# Patient Record
Sex: Female | Born: 1940 | Hispanic: Yes | Smoking: Never smoker
Health system: Southern US, Community
[De-identification: ages and names within clinical notes are randomized; demographics above are authoritative.]

## PROBLEM LIST (undated history)

## (undated) DIAGNOSIS — E785 Hyperlipidemia, unspecified: Secondary | ICD-10-CM

## (undated) DIAGNOSIS — Q761 Klippel-Feil syndrome: Secondary | ICD-10-CM

## (undated) DIAGNOSIS — T7840XA Allergy, unspecified, initial encounter: Secondary | ICD-10-CM

## (undated) DIAGNOSIS — F419 Anxiety disorder, unspecified: Secondary | ICD-10-CM

## (undated) DIAGNOSIS — K219 Gastro-esophageal reflux disease without esophagitis: Secondary | ICD-10-CM

## (undated) DIAGNOSIS — I10 Essential (primary) hypertension: Secondary | ICD-10-CM

## (undated) HISTORY — DX: Anxiety disorder, unspecified: F41.9

## (undated) HISTORY — DX: Allergy, unspecified, initial encounter: T78.40XA

## (undated) HISTORY — DX: Gastro-esophageal reflux disease without esophagitis: K21.9

## (undated) HISTORY — DX: Essential (primary) hypertension: I10

## (undated) HISTORY — DX: Klippel-Feil syndrome: Q76.1

## (undated) HISTORY — DX: Hyperlipidemia, unspecified: E78.5

---

## 2001-02-25 HISTORY — PX: CERVICAL FUSION: SHX112

## 2020-02-23 ENCOUNTER — Ambulatory Visit: Payer: Medicare Other | Admitting: Medical

## 2020-03-10 ENCOUNTER — Ambulatory Visit: Payer: Medicare Other | Admitting: Medical

## 2020-03-15 ENCOUNTER — Encounter: Payer: Self-pay | Admitting: Medical

## 2020-03-15 ENCOUNTER — Ambulatory Visit (INDEPENDENT_AMBULATORY_CARE_PROVIDER_SITE_OTHER): Payer: Medicare Other | Admitting: Medical

## 2020-03-15 ENCOUNTER — Other Ambulatory Visit: Payer: Self-pay

## 2020-03-15 VITALS — BP 138/53 | HR 74 | Resp 18 | Ht 60.0 in | Wt 121.6 lb

## 2020-03-15 DIAGNOSIS — R739 Hyperglycemia, unspecified: Secondary | ICD-10-CM

## 2020-03-15 DIAGNOSIS — F419 Anxiety disorder, unspecified: Secondary | ICD-10-CM

## 2020-03-15 DIAGNOSIS — R7989 Other specified abnormal findings of blood chemistry: Secondary | ICD-10-CM

## 2020-03-15 DIAGNOSIS — I10 Essential (primary) hypertension: Secondary | ICD-10-CM | POA: Diagnosis not present

## 2020-03-15 DIAGNOSIS — M2550 Pain in unspecified joint: Secondary | ICD-10-CM | POA: Diagnosis not present

## 2020-03-15 DIAGNOSIS — E785 Hyperlipidemia, unspecified: Secondary | ICD-10-CM

## 2020-03-15 DIAGNOSIS — K219 Gastro-esophageal reflux disease without esophagitis: Secondary | ICD-10-CM | POA: Diagnosis not present

## 2020-03-15 DIAGNOSIS — Z7185 Encounter for immunization safety counseling: Secondary | ICD-10-CM

## 2020-03-15 LAB — COMPREHENSIVE METABOLIC PANEL WITH GFR
ALT: 13 U/L (ref 0–35)
AST: 26 U/L (ref 0–37)
Albumin: 4.4 g/dL (ref 3.5–5.2)
Alkaline Phosphatase: 91 U/L (ref 39–117)
BUN: 21 mg/dL (ref 6–23)
CO2: 34 meq/L — ABNORMAL HIGH (ref 19–32)
Calcium: 9.9 mg/dL (ref 8.4–10.5)
Chloride: 97 meq/L (ref 96–112)
Creatinine, Ser: 0.91 mg/dL (ref 0.40–1.20)
GFR: 60.48 mL/min
Glucose, Bld: 90 mg/dL (ref 70–99)
Potassium: 4.3 meq/L (ref 3.5–5.1)
Sodium: 138 meq/L (ref 135–145)
Total Bilirubin: 0.5 mg/dL (ref 0.2–1.2)
Total Protein: 8.7 g/dL — ABNORMAL HIGH (ref 6.0–8.3)

## 2020-03-15 LAB — CBC WITH DIFFERENTIAL/PLATELET
Basophils Absolute: 0 10*3/uL (ref 0.0–0.1)
Basophils Relative: 0.5 % (ref 0.0–3.0)
Eosinophils Absolute: 0.1 10*3/uL (ref 0.0–0.7)
Eosinophils Relative: 1.6 % (ref 0.0–5.0)
HCT: 41.7 % (ref 36.0–46.0)
Hemoglobin: 14.3 g/dL (ref 12.0–15.0)
Lymphocytes Relative: 41.4 % (ref 12.0–46.0)
Lymphs Abs: 2.4 10*3/uL (ref 0.7–4.0)
MCHC: 34.3 g/dL (ref 30.0–36.0)
MCV: 94.1 fl (ref 78.0–100.0)
Monocytes Absolute: 0.6 10*3/uL (ref 0.1–1.0)
Monocytes Relative: 10.7 % (ref 3.0–12.0)
Neutro Abs: 2.6 10*3/uL (ref 1.4–7.7)
Neutrophils Relative %: 45.8 % (ref 43.0–77.0)
Platelets: 201 10*3/uL (ref 150.0–400.0)
RBC: 4.44 Mil/uL (ref 3.87–5.11)
RDW: 12.4 % (ref 11.5–15.5)
WBC: 5.8 10*3/uL (ref 4.0–10.5)

## 2020-03-15 LAB — LIPID PANEL
Cholesterol: 174 mg/dL (ref 0–200)
HDL: 59 mg/dL (ref >39.00)
LDL Cholesterol: 88 mg/dL (ref 0–99)
NonHDL: 114.56
Total CHOL/HDL Ratio: 3
Triglycerides: 131 mg/dL (ref 0.0–149.0)
VLDL: 26.2 mg/dL (ref 0.0–40.0)

## 2020-03-15 LAB — HEMOGLOBIN A1C: Hgb A1c MFr Bld: 6 % (ref 4.6–6.5)

## 2020-03-15 LAB — SEDIMENTATION RATE: Sed Rate: 67 mm/hr — ABNORMAL HIGH (ref 0–30)

## 2020-03-15 LAB — C-REACTIVE PROTEIN: CRP: <1 mg/dL (ref 0.5–20.0)

## 2020-03-15 MED ORDER — PANTOPRAZOLE SODIUM 20 MG PO TBEC: 20.0000 mg | DELAYED_RELEASE_TABLET | Freq: Two times a day (BID) | ORAL | 3 refills | Status: DC

## 2020-03-15 MED ORDER — VITAMIN D 25 MCG (1000 UNIT) PO TABS: 1000.0000 [IU] | ORAL_TABLET | Freq: Every day | ORAL | 3 refills | Status: DC

## 2020-03-15 MED ORDER — ASPIRIN 81 MG PO CHEW
81.0000 mg | CHEWABLE_TABLET | Freq: Every day | ORAL | 3 refills | Status: DC
Start: 2020-03-15 — End: 2020-03-17

## 2020-03-15 MED ORDER — METOPROLOL TARTRATE 25 MG PO TABS: 25.0000 mg | ORAL_TABLET | Freq: Two times a day (BID) | ORAL | 3 refills | Status: DC

## 2020-03-15 MED ORDER — VITAMIN B-1 50 MG PO TABS: 50.0000 mg | ORAL_TABLET | Freq: Every day | ORAL | 1 refills | Status: DC

## 2020-03-15 MED ORDER — HYDROCHLOROTHIAZIDE 12.5 MG PO CAPS: 12.5000 mg | ORAL_CAPSULE | Freq: Every day | ORAL | 3 refills | Status: DC

## 2020-03-15 NOTE — Progress Notes (Addendum)
Subjective:    Patient ID: Nina Holmes, female    DOB: [birth year], 80 y.o.   MRN: [id number]  HPI  Pt in for the first time.  Pt has prediabetes, arthritis, diverticulitis, allergic rhinitis, gerd, htn, hyperlipidemia, anxiety and remote hx of heart palpitations.  Gerd controlled on protonix 20 mg daily.  htn controlled on metoprolol 25 mg twice daily and hctz 12.5mg  daily.  Hx of low vit D.  Hx of high cholesterol. On atorvastatin.   Diverticulitis hx but no flares in past 5 years.   Anxiety controlled on low dose xanax. Max use 25-30 tab a month.  Recent stress from moving.      Review of Systems  Constitutional: Negative for chills, fatigue and fever.  HENT: Negative for congestion.   Respiratory: Negative for cough, chest tightness, shortness of breath and wheezing.   Cardiovascular: Negative for chest pain and palpitations.  Gastrointestinal: Negative for abdominal pain, blood in stool, diarrhea and vomiting.       Genella Rife currently controlled.  Genitourinary: Negative for dysuria and flank pain.  Musculoskeletal: Negative for back pain, joint swelling and neck pain.  Skin: Negative for rash.  Neurological: Negative for dizziness, speech difficulty, weakness, numbness and headaches.  Hematological: Negative for adenopathy. Does not bruise/bleed easily.  Psychiatric/Behavioral: Negative for dysphoric mood. The patient is nervous/anxious.     No past medical history on file.   Social History   Socioeconomic History  . Marital status: Divorced    Spouse name: Not on file  . Number of children: Not on file  . Years of education: Not on file  . Highest education level: Not on file  Occupational History  . Not on file  Tobacco Use  . Smoking status: Not on file  . Smokeless tobacco: Not on file  Substance and Sexual Activity  . Alcohol use: Not on file  . Drug use: Not on file  . Sexual activity: Not on file  Other Topics Concern  . Not on file   Social History Narrative  . Not on file   Social Determinants of Health   Financial Resource Strain: Not on file  Food Insecurity: Not on file  Transportation Needs: Not on file  Physical Activity: Not on file  Stress: Not on file  Social Connections: Not on file  Intimate Partner Violence: Not on file     No family history on file.  Not on File  Current Outpatient Medications on File Prior to Visit  Medication Sig Dispense Refill  . ALPRAZolam (XANAX) 0.5 MG tablet Take 0.5 mg by mouth at bedtime as needed for anxiety.    Marland Kitchen aspirin 81 MG chewable tablet Chew by mouth daily.    . cholecalciferol (VITAMIN D3) 25 MCG (1000 UNIT) tablet Take 1,000 Units by mouth daily.    . hydrochlorothiazide (MICROZIDE) 12.5 MG capsule Take 12.5 mg by mouth daily.    Marland Kitchen thiamine (VITAMIN B-1) 50 MG tablet Take 50 mg by mouth daily.    Marland Kitchen atorvastatin (LIPITOR) 10 MG tablet Take 10 mg by mouth daily.    . metoprolol tartrate (LOPRESSOR) 25 MG tablet Take 25 mg by mouth 2 (two) times daily.    . pantoprazole (PROTONIX) 20 MG tablet Take 20 mg by mouth 2 (two) times daily.     No current facility-administered medications on file prior to visit.    BP (!) 138/53   Pulse 74   Resp 18   Ht 5' (1.524 m)  Wt 121 lb 9.6 oz (55.2 kg)   SpO2 98%   BMI 23.75 kg/m      Objective:   Physical Exam  General Mental Status- Alert. General Appearance- Not in acute distress.   Skin General: Color- Normal Color. Moisture- Normal Moisture.  Neck Carotid Arteries- Normal color. Moisture- Normal Moisture. No carotid bruits. No JVD.  Chest and Lung Exam Auscultation: Breath Sounds:-Normal.  Cardiovascular Auscultation:Rythm- Regular. Murmurs & Other Heart Sounds:Auscultation of the heart reveals- No Murmurs.  Abdomen Inspection:-Inspeection Normal. Palpation/Percussion:Note:No mass. Palpation and Percussion of the abdomen reveal- Non Tender, Non Distended + BS, no rebound or  guarding.   Neurologic Cranial Nerve exam:- CN III-XII intact(No nystagmus), symmetric smile. Strength:- 5/5 equal and symmetric strength both upper and lower extremities.      Assessment & Plan:  Pt has prediabetes, arthritis, diverticulitis, allergic rhinitis, gerd, htn, hyperlipidemia, anxiety and remote hx of heart palpitations.  Allergic rhinitis controlled presently.  Gerd controlled on protonix 20 mg daily.  htn controlled on metoprolol 25 mg twice daily and hctz 12.5mg  daily.  Hx of low vit D. Will repeat level today.  Hx of high cholesterol. On atorvastatin.   Diverticulitis hx but no flares in past 5 years.   Anxiety controlled on low dose xanax. Max use 25-30 tab a month.  After labs may be able to prescribe nsaid.  Prior palpitations appear to have be anxiety related.   Esperanza Richters, PA-C   Time spent with patient today was 45  Minutes consisted of discussing various  diagnosis, work up, treatment, explaining drug screen/contract. and documentation.

## 2020-03-15 NOTE — Addendum Note (Signed)
Addended by: Gwenevere Abbot on: 03/15/2020 07:38 PM   Modules accepted: Orders

## 2020-03-15 NOTE — Addendum Note (Signed)
Addended by: Gwenevere Abbot on: 03/15/2020 05:43 PM   Modules accepted: Level of Service

## 2020-03-15 NOTE — Addendum Note (Signed)
Addended by: Gwenevere Abbot on: 03/15/2020 02:00 PM   Modules accepted: Level of Service

## 2020-03-15 NOTE — Patient Instructions (Addendum)
Pt has prediabetes, arthritis, diverticulitis, allergic rhinitis, gerd, htn, hyperlipidemia, anxiety and remote hx of heart palpitations.  Allergic rhinitis controlled presently.  Gerd controlled on protonix 20 mg daily.  htn controlled on metoprolol 25 mg twice daily and hctz 12.5mg  daily.  Hx of low vit D. Will repeat level today.  Hx of high cholesterol. On atorvastatin.   Diverticulitis hx but no flares in past 5 years.   Anxiety controlled on low dose xanax. Max use 25-30 tab a month.  Prior palpitations appear to have be anxiety related. If reoccur notify us.  After labs may be able to prescribe nsaid.  Follow up in one month or as needed

## 2020-03-16 ENCOUNTER — Telehealth: Payer: Self-pay | Admitting: Medical

## 2020-03-16 MED ORDER — METHYLPREDNISOLONE 4 MG PO TABS: ORAL_TABLET | ORAL | 0 refills | Status: DC

## 2020-03-16 NOTE — Telephone Encounter (Signed)
Medrol taper sent for elevated sed rate, hip pain and back pain.

## 2020-03-17 ENCOUNTER — Telehealth: Payer: Self-pay | Admitting: Medical

## 2020-03-17 ENCOUNTER — Ambulatory Visit: Payer: Medicare Other | Admitting: Medical

## 2020-03-17 MED ORDER — ATORVASTATIN CALCIUM 10 MG PO TABS: 10.0000 mg | ORAL_TABLET | Freq: Every day | ORAL | 2 refills | Status: DC

## 2020-03-17 MED ORDER — PANTOPRAZOLE SODIUM 20 MG PO TBEC: 20.0000 mg | DELAYED_RELEASE_TABLET | Freq: Two times a day (BID) | ORAL | 3 refills | Status: DC

## 2020-03-17 MED ORDER — METOPROLOL TARTRATE 25 MG PO TABS: 25.0000 mg | ORAL_TABLET | Freq: Two times a day (BID) | ORAL | 3 refills | Status: DC

## 2020-03-17 MED ORDER — VITAMIN B-1 50 MG PO TABS: 50.0000 mg | ORAL_TABLET | Freq: Every day | ORAL | 1 refills | Status: DC

## 2020-03-17 MED ORDER — METHYLPREDNISOLONE 4 MG PO TABS: ORAL_TABLET | ORAL | 0 refills | Status: DC

## 2020-03-17 MED ORDER — VITAMIN D 25 MCG (1000 UNIT) PO TABS: 1000.0000 [IU] | ORAL_TABLET | Freq: Every day | ORAL | 3 refills | Status: DC

## 2020-03-17 MED ORDER — ASPIRIN 81 MG PO CHEW: 81.0000 mg | CHEWABLE_TABLET | Freq: Every day | ORAL | 3 refills | Status: DC

## 2020-03-17 MED ORDER — HYDROCHLOROTHIAZIDE 12.5 MG PO CAPS: 12.5000 mg | ORAL_CAPSULE | Freq: Every day | ORAL | 3 refills | Status: DC

## 2020-03-17 NOTE — Telephone Encounter (Signed)
Patient spoke with ruth .

## 2020-03-17 NOTE — Telephone Encounter (Signed)
Rx sent 

## 2020-03-17 NOTE — Telephone Encounter (Signed)
Patient states all medication were sent to the wrong pharmacy.  aspirin 81 MG chewable tablet [[id number]]   cholecalciferol (VITAMIN D3) 25 MCG (1000 UNIT) tablet [[id number]]   hydrochlorothiazide (MICROZIDE) 12.5 MG capsule [[id number]]   methylPREDNISolone (MEDROL) 4 MG tablet [[id number]]  metoprolol tartrate (LOPRESSOR) 25 MG tablet [[id number]]   pantoprazole (PROTONIX) 20 MG tablet [[id number]]   thiamine (VITAMIN B-1) 50 MG tablet [[id number]]    [id number] - [place], [place] - [address] AT [facility] [address] Phone:  [phone]  Fax:  [phone]

## 2020-03-17 NOTE — Telephone Encounter (Signed)
Called back for lab results

## 2020-03-20 LAB — VITAMIN D 1,25 DIHYDROXY
Vitamin D 1, 25 (OH)2 Total: 25 pg/mL (ref 18–72)
Vitamin D2 1, 25 (OH)2: <8 pg/mL
Vitamin D3 1, 25 (OH)2: 25 pg/mL

## 2020-03-20 LAB — RHEUMATOID FACTOR: Rheumatoid fact SerPl-aCnc: <14 IU/mL (ref <14)

## 2020-03-20 LAB — ANA: Anti Nuclear Antibody (ANA): NEGATIVE

## 2020-03-22 ENCOUNTER — Telehealth: Payer: Self-pay | Admitting: Medical

## 2020-03-22 NOTE — Telephone Encounter (Signed)
All other meds sent to pharmacy on 03/17/2020; just need xanax   Requesting: xanax Contract: n/a UDS: n/a Last Visit:03/15/2020 Next Visit:2/21 Last Refill: n/a  Please Advise

## 2020-03-22 NOTE — Telephone Encounter (Signed)
Medication:  metoprolol tartrate (LOPRESSOR) 25 MG tablet [[id number]  hydrochlorothiazide (MICROZIDE) 12.5 MG capsule [[id number]]   atorvastatin (LIPITOR) 10 MG tablet [[id number]]   ALPRAZolam (XANAX) 0.5 MG tablet [[id number]]   pantoprazole (PROTONIX) 20 MG tablet [[id number]]   thiamine (VITAMIN B-1) 50 MG tablet [[id number]]   cholecalciferol (VITAMIN D3) 25 MCG (1000 UNIT) tablet [[id number]]     Has the patient contacted their pharmacy? no (If no, request that the patient contact the pharmacy for the refill.) (If yes, when and what did the pharmacy advise?)    Preferred Pharmacy (with phone number or street name):   [id number] - [place], [place] - [address] AT [facility] [address] Phone:  [phone]  Fax:  [phone]         Agent: Please be advised that RX refills may take up to 3 business days. We ask that you follow-up with your pharmacy.

## 2020-03-23 MED ORDER — ALPRAZOLAM 0.5 MG PO TABS: 0.5000 mg | ORAL_TABLET | Freq: Every evening | ORAL | 0 refills | Status: DC | PRN

## 2020-03-23 NOTE — Telephone Encounter (Signed)
14 tab rx sent to pt pharmacy. She needs to have uds and do contract within next 2 weeks. Then can give 30 tab rx on next refill

## 2020-03-23 NOTE — Addendum Note (Signed)
Addended by: Gwenevere Abbot on: 03/23/2020 12:48 PM   Modules accepted: Orders

## 2020-03-24 NOTE — Telephone Encounter (Signed)
Called and left VM on both the pt and daughters phone number. -JMA

## 2020-03-28 NOTE — Telephone Encounter (Signed)
Called pt and advised about UDS she stated that she has an apt on 04/17/20 and will wait until then to do all at once. -JMA

## 2020-04-10 ENCOUNTER — Ambulatory Visit: Payer: Medicare Other | Admitting: Medical

## 2020-04-17 ENCOUNTER — Ambulatory Visit: Payer: Medicare Other | Admitting: Medical

## 2020-05-10 ENCOUNTER — Other Ambulatory Visit: Payer: Self-pay | Admitting: Medical

## 2020-05-10 DIAGNOSIS — F419 Anxiety disorder, unspecified: Secondary | ICD-10-CM

## 2020-05-10 DIAGNOSIS — R8290 Unspecified abnormal findings in urine: Secondary | ICD-10-CM

## 2020-05-10 MED ORDER — PANTOPRAZOLE SODIUM 20 MG PO TBEC: 20.0000 mg | DELAYED_RELEASE_TABLET | Freq: Two times a day (BID) | ORAL | 3 refills | Status: DC

## 2020-05-10 MED ORDER — ATORVASTATIN CALCIUM 10 MG PO TABS: 10.0000 mg | ORAL_TABLET | Freq: Every day | ORAL | 2 refills | Status: DC

## 2020-05-10 NOTE — Telephone Encounter (Signed)
Requesting: xanax Contract:n/a UDS:n/a Last Visit: 02/2020 Next Visit:n/a Last Refill:03/13/2020  Please Advise

## 2020-05-10 NOTE — Telephone Encounter (Signed)
Medication: ALPRAZolam (XANAX) 0.5 MG tablet [[id number]]   atorvastatin (LIPITOR) 10 MG tablet [[id number]]   pantoprazole (PROTONIX) 20 MG tablet [[id number]]      Has the patient contacted their pharmacy? no (If no, request that the patient contact the pharmacy for the refill.) (If yes, when and what did the pharmacy advise?)    Preferred Pharmacy (with phone number or street name):   [id number] - [place], [place] - [address] AT [facility] [address] Phone:  [phone]  Fax:  [phone]          Agent: Please be advised that RX refills may take up to 3 business days. We ask that you follow-up with your pharmacy.

## 2020-05-10 NOTE — Telephone Encounter (Signed)
Refilled atorvastatin and protonix. Needs appt to discuss anxiety. May need uds and contract going forward.

## 2020-05-10 NOTE — Telephone Encounter (Addendum)
Requesting:Xanax Contract: n/a  UDS: n/a Last Visit:03/15/20 Next Visit:n/a Last Refill:02/2020  Please Advise  New pt who I saw one time. Limited rx given. Have her follow up. She used 14 tab in 2 months. Best to have her be on contract/uds so can fill at regular intervals when needed.  Declined the rx today  If having acute anxiety episodes recently and can't get in until next Monday let me know and will send in 6-8 tab prescription.

## 2020-05-12 NOTE — Telephone Encounter (Signed)
No UDS just contract

## 2020-05-12 NOTE — Telephone Encounter (Signed)
Contract was done at time of initial visit , medical records just uploaded xanax to file

## 2020-05-13 NOTE — Telephone Encounter (Signed)
Have pt schedule folllow up next week. Signs contract, give uds so can prescribe when needed going forward.

## 2020-05-15 NOTE — Telephone Encounter (Signed)
When she give urine on 25 th let me know and will send in xanax rx at that time.  What diagnosis did you place for ua and culture. On review of my last note I don't see that she had any symptoms at that time.

## 2020-05-19 ENCOUNTER — Other Ambulatory Visit (INDEPENDENT_AMBULATORY_CARE_PROVIDER_SITE_OTHER): Payer: Medicare Other

## 2020-05-19 ENCOUNTER — Other Ambulatory Visit: Payer: Self-pay

## 2020-05-19 ENCOUNTER — Other Ambulatory Visit: Payer: Medicare Other

## 2020-05-19 DIAGNOSIS — Z79899 Other long term (current) drug therapy: Secondary | ICD-10-CM

## 2020-05-19 DIAGNOSIS — F419 Anxiety disorder, unspecified: Secondary | ICD-10-CM

## 2020-05-19 DIAGNOSIS — R8290 Unspecified abnormal findings in urine: Secondary | ICD-10-CM | POA: Diagnosis not present

## 2020-05-19 LAB — POC URINALSYSI DIPSTICK (AUTOMATED)
Bilirubin, UA: NEGATIVE
Glucose, UA: NEGATIVE
Ketones, UA: NEGATIVE
Nitrite, UA: NEGATIVE
Protein, UA: POSITIVE — AB
Spec Grav, UA: >=1.03 — AB (ref 1.010–1.025)
Urobilinogen, UA: 0.2 E.U./dL
pH, UA: 5 (ref 5.0–8.0)

## 2020-05-19 NOTE — Progress Notes (Signed)
Pt was not able to get enough urine to complete uds. Only had enough urine to do partial culture and am not sure if lab can do culture on the amount sent but we will await determination from lab.  Pt will return Monday for UDS.

## 2020-05-21 ENCOUNTER — Telehealth: Payer: Self-pay | Admitting: Medical

## 2020-05-21 LAB — URINE CULTURE
MICRO NUMBER:: 11693395
SPECIMEN QUALITY:: ADEQUATE

## 2020-05-21 MED ORDER — AMOXICILLIN 875 MG PO TABS
875.0000 mg | ORAL_TABLET | Freq: Two times a day (BID) | ORAL | 0 refills | Status: DC
Start: 2020-05-21 — End: 2020-05-25

## 2020-05-21 NOTE — Telephone Encounter (Signed)
Amoxacillin sent to pt pharmacy.

## 2020-05-25 ENCOUNTER — Telehealth: Payer: Self-pay | Admitting: Medical

## 2020-05-25 MED ORDER — AMOXICILLIN-POT CLAVULANATE 400-57 MG/5ML PO SUSR: ORAL | 0 refills | Status: DC

## 2020-05-25 NOTE — Telephone Encounter (Signed)
Rx augmentin suspension sent to pt pharmacy.

## 2020-05-25 NOTE — Telephone Encounter (Signed)
Patient states amoxacillin is to big , patient would like another medication sent to pharmacy . Patient like a liquid medication.    Please advise

## 2020-05-25 NOTE — Telephone Encounter (Signed)
Rx augmentin suspension sent to pt pharmacy. 

## 2020-06-02 ENCOUNTER — Other Ambulatory Visit: Payer: Medicare Other

## 2020-06-02 ENCOUNTER — Other Ambulatory Visit: Payer: Self-pay

## 2020-06-02 DIAGNOSIS — Z79899 Other long term (current) drug therapy: Secondary | ICD-10-CM

## 2020-06-02 DIAGNOSIS — F419 Anxiety disorder, unspecified: Secondary | ICD-10-CM

## 2020-06-03 LAB — DRUG MONITORING, PANEL 8 WITH CONFIRMATION, URINE
6 Acetylmorphine: NEGATIVE ng/mL (ref <10)
Alcohol Metabolites: NEGATIVE ng/mL
Amphetamines: NEGATIVE ng/mL (ref <500)
Benzodiazepines: NEGATIVE ng/mL (ref <100)
Buprenorphine, Urine: NEGATIVE ng/mL (ref <5)
Cocaine Metabolite: NEGATIVE ng/mL (ref <150)
Creatinine: 21.2 mg/dL
MDMA: NEGATIVE ng/mL (ref <500)
Marijuana Metabolite: NEGATIVE ng/mL (ref <20)
Opiates: NEGATIVE ng/mL (ref <100)
Oxidant: NEGATIVE ug/mL
Oxycodone: NEGATIVE ng/mL (ref <100)
pH: 6.4 (ref 4.5–9.0)

## 2020-06-03 LAB — DM TEMPLATE

## 2020-06-12 ENCOUNTER — Telehealth: Payer: Self-pay | Admitting: Medical

## 2020-06-12 MED ORDER — ALPRAZOLAM 0.5 MG PO TABS: 0.5000 mg | ORAL_TABLET | Freq: Every evening | ORAL | 0 refills | Status: DC | PRN

## 2020-06-12 NOTE — Telephone Encounter (Addendum)
Requesting: xanax Contract:05/11/2020 UDS:06/02/2020 Last Visit:03/15/2020 Next Visit:n/a Last Refill:03/23/2020  Please Advise  Rx xanax refill sent to pt pharmacy.

## 2020-06-12 NOTE — Telephone Encounter (Signed)
Medication:ALPRAZolam (XANAX) 0.5 MG tablet [[id number]]       Has the patient contacted their pharmacy? no (If no, request that the patient contact the pharmacy for the refill.) (If yes, when and what did the pharmacy advise?)    Preferred Pharmacy (with phone number or street name): [id number] - [place], [place] - [address] AT [facility] [address] Phone:  [phone]  Fax:  [phone]          Agent: Please be advised that RX refills may take up to 3 business days. We ask that you follow-up with your pharmacy.

## 2020-06-16 ENCOUNTER — Other Ambulatory Visit: Payer: Self-pay | Admitting: Medical

## 2020-06-27 ENCOUNTER — Ambulatory Visit (INDEPENDENT_AMBULATORY_CARE_PROVIDER_SITE_OTHER): Payer: Medicare Other | Admitting: Medical

## 2020-06-27 ENCOUNTER — Encounter: Payer: Self-pay | Admitting: Medical

## 2020-06-27 ENCOUNTER — Other Ambulatory Visit: Payer: Self-pay

## 2020-06-27 VITALS — BP 126/62 | HR 72 | Resp 18 | Ht 60.0 in | Wt 116.2 lb

## 2020-06-27 DIAGNOSIS — K219 Gastro-esophageal reflux disease without esophagitis: Secondary | ICD-10-CM | POA: Diagnosis not present

## 2020-06-27 DIAGNOSIS — F419 Anxiety disorder, unspecified: Secondary | ICD-10-CM | POA: Diagnosis not present

## 2020-06-27 DIAGNOSIS — R1310 Dysphagia, unspecified: Secondary | ICD-10-CM

## 2020-06-27 DIAGNOSIS — I10 Essential (primary) hypertension: Secondary | ICD-10-CM

## 2020-06-27 DIAGNOSIS — E785 Hyperlipidemia, unspecified: Secondary | ICD-10-CM

## 2020-06-27 DIAGNOSIS — R739 Hyperglycemia, unspecified: Secondary | ICD-10-CM | POA: Diagnosis not present

## 2020-06-27 MED ORDER — ALPRAZOLAM 0.5 MG PO TABS: 0.5000 mg | ORAL_TABLET | Freq: Every evening | ORAL | 1 refills | Status: DC | PRN

## 2020-06-27 NOTE — Progress Notes (Signed)
Subjective:    Patient ID: Nina Holmes, female    DOB: 1940-03-19, 80 y.o.   MRN: [id number]  HPI  Pt in for follow up.  Hx of htn, high cholesterol, gerd, low vit D and anxiety.  Pt has htn. Bp is well controlled.   Hx of high cholesterol. Pt is on atorvastatin. Last lipid panel well controled.  Pt had given uds and signed controlled med contract. She describes limited use.   Pt has low vit D in past. Lower end normal.   Hx of gerd. Pt is on protonix. Stomach symptoms controlled but also states has some sensation as if her esophagus closely. This going on for more than a year.     Review of Systems  Constitutional: Negative for chills, fatigue and fever.  Respiratory: Negative for cough, chest tightness, shortness of breath and wheezing.   Cardiovascular: Negative for chest pain and palpitations.  Gastrointestinal: Negative for abdominal pain and blood in stool.  Musculoskeletal: Negative for back pain.  Skin: Negative for rash.  Neurological: Negative for dizziness, syncope, speech difficulty, weakness, numbness and headaches.  Hematological: Negative for adenopathy. Does not bruise/bleed easily.  Psychiatric/Behavioral: Negative for behavioral problems, confusion, self-injury and suicidal ideas. The patient is nervous/anxious.     Past Medical History:  Diagnosis Date  . Allergy   . Anxiety   . Cervical fusion syndrome   . GERD (gastroesophageal reflux disease)   . Hyperlipidemia   . Hypertension      Social History   Socioeconomic History  . Marital status: Divorced    Spouse name: Not on file  . Number of children: Not on file  . Years of education: Not on file  . Highest education level: Not on file  Occupational History  . Not on file  Tobacco Use  . Smoking status: Not on file  . Smokeless tobacco: Not on file  Substance and Sexual Activity  . Alcohol use: Not Currently  . Drug use: Never  . Sexual activity: Not Currently  Other Topics  Concern  . Not on file  Social History Narrative  . Not on file   Social Determinants of Health   Financial Resource Strain: Not on file  Food Insecurity: Not on file  Transportation Needs: Not on file  Physical Activity: Not on file  Stress: Not on file  Social Connections: Not on file  Intimate Partner Violence: Not on file     No family history on file.  Allergies  Allergen Reactions  . Bentyl [Dicyclomine] Other (See Comments)    Passed out  . Codeine Nausea Only  . Cortizone-10 [Hydrocortisone]     Burning sensation in tomach  . Sulfa Antibiotics Rash    Current Outpatient Medications on File Prior to Visit  Medication Sig Dispense Refill  . ALPRAZolam (XANAX) 0.5 MG tablet Take 1 tablet (0.5 mg total) by mouth at bedtime as needed for anxiety or sleep. 14 tablet 0  . aspirin 81 MG chewable tablet Chew 1 tablet (81 mg total) by mouth daily. 90 tablet 3  . atorvastatin (LIPITOR) 10 MG tablet TAKE 1 TABLET(10 MG) BY MOUTH DAILY 30 tablet 2  . cholecalciferol (VITAMIN D3) 25 MCG (1000 UNIT) tablet Take 1 tablet (1,000 Units total) by mouth daily. 90 tablet 3  . hydrochlorothiazide (MICROZIDE) 12.5 MG capsule Take 1 capsule (12.5 mg total) by mouth daily. 90 capsule 3  . metoprolol tartrate (LOPRESSOR) 25 MG tablet Take 1 tablet (25 mg total) by mouth  2 (two) times daily. 180 tablet 3  . pantoprazole (PROTONIX) 20 MG tablet Take 1 tablet (20 mg total) by mouth 2 (two) times daily. 180 tablet 3  . thiamine (VITAMIN B-1) 50 MG tablet Take 1 tablet (50 mg total) by mouth daily. 90 tablet 1   No current facility-administered medications on file prior to visit.    BP 126/62   Pulse 72   Resp 18   Ht 5' (1.524 m)   Wt 116 lb 3.2 oz (52.7 kg)   SpO2 96%   BMI 22.69 kg/m       Objective:   Physical Exam  General Mental Status- Alert. General Appearance- Not in acute distress.   Skin General: Color- Normal Color. Moisture- Normal Moisture.  Neck Carotid  Arteries- Normal color. Moisture- Normal Moisture. No carotid bruits. No JVD.  Chest and Lung Exam Auscultation: Breath Sounds:-Normal.  Cardiovascular Auscultation:Rythm- Regular. Murmurs & Other Heart Sounds:Auscultation of the heart reveals- No Murmurs.  Abdomen Inspection:-Inspeection Normal. Palpation/Percussion:Note:No mass. Palpation and Percussion of the abdomen reveal- Non Tender, Non Distended + BS, no rebound or guarding.   Neurologic Cranial Nerve exam:- CN III-XII intact(No nystagmus), symmetric smile. Strength:- 5/5 equal and symmetric strength both upper and lower extremities.      Assessment & Plan:  Your bp is well controlled. Continue hctz and metoprolol.  For gerd and difficulty swallowing continue protonix and referred you to GI MD.  For high cholesterol continue atorvastatin.  For elevated sugar continue with low sugar diet.  For lower end vit D advise to increase to 2000 iu daily dose.  For anxiety will prescribe refill of xanax. Rx advisement given.  Follow up 4 months or as needed  [employer]

## 2020-06-27 NOTE — Patient Instructions (Addendum)
Your bp is well controlled. Continue hctz and metoprolol.  For gerd and difficulty swallowing continue protonix and referred you to GI MD.  For high cholesterol continue atorvastatin.  For elevated sugar continue with low sugar diet.  For lower end vit D advise to increase to 2000 iu daily dose.  For anxiety will prescribe refill of xanax. Rx advisement given.  Follow up 4 months or as needed

## 2020-06-28 ENCOUNTER — Encounter: Payer: Self-pay | Admitting: Gastroenterology

## 2020-06-28 ENCOUNTER — Ambulatory Visit: Payer: Medicare Other | Admitting: Medical

## 2020-07-05 ENCOUNTER — Encounter: Payer: Self-pay | Admitting: Gastroenterology

## 2020-08-01 ENCOUNTER — Encounter: Payer: Self-pay | Admitting: Gastroenterology

## 2020-08-01 ENCOUNTER — Other Ambulatory Visit: Payer: Self-pay

## 2020-08-01 ENCOUNTER — Other Ambulatory Visit (HOSPITAL_COMMUNITY): Payer: Self-pay

## 2020-08-01 ENCOUNTER — Ambulatory Visit (INDEPENDENT_AMBULATORY_CARE_PROVIDER_SITE_OTHER): Payer: Medicare Other | Admitting: Gastroenterology

## 2020-08-01 VITALS — BP 128/60 | HR 70 | Ht 60.0 in | Wt 122.0 lb

## 2020-08-01 DIAGNOSIS — K2100 Gastro-esophageal reflux disease with esophagitis, without bleeding: Secondary | ICD-10-CM | POA: Diagnosis not present

## 2020-08-01 DIAGNOSIS — R1312 Dysphagia, oropharyngeal phase: Secondary | ICD-10-CM | POA: Diagnosis not present

## 2020-08-01 DIAGNOSIS — R1310 Dysphagia, unspecified: Secondary | ICD-10-CM

## 2020-08-01 NOTE — Patient Instructions (Addendum)
If you are age 79 or older, your body mass index should be between 23-30. Your Body mass index is 23.83 kg/m. If this is out of the aforementioned range listed, please consider follow up with your Primary Care Provider.  If you are age 25 or younger, your body mass index should be between 19-25. Your Body mass index is 23.83 kg/m. If this is out of the aformentioned range listed, please consider follow up with your Primary Care Provider.   __________________________________________________________  The [facility] providers would like to encourage you to use [facility] to communicate with providers for non-urgent requests or questions.  Due to long hold times on the telephone, sending your provider a message by [facility] may be a faster and more efficient way to get a response.  Please allow 48 business hours for a response.  Please remember that this is for non-urgent requests.  ___________________________________________________________  Bonita Quin have been scheduled for a modified barium swallow on 08/16/2020 at 1:00 pm . Please arrive 15 minutes prior to your test for registration. You will go to [facility] (1st Floor) for your appointment. Should you need to cancel or reschedule your appointment, please contact [phone] Pat[facility] D[facility]bin) or [phone] [facility]).  _____________________________________________________________________ A Modified Barium Swallow Study, or MBS, is a special x-ray that is taken to check swallowing skills. It is carried out by a Marine scientist and a Warehouse manager (SLP). During this test, yourmouth, throat, and esophagus, a muscular tube which connects your mouth to your stomach, is checked. The test will help you, your doctor, and the SLP plan what types of foods and liquids are easier for you to swallow. The SLP will also identify positions and ways to help you swallow more easily and safely. What will happen during an MBS? You will be taken to an x-ray room and  seated comfortably. You will be asked to swallow small amounts of food and liquid mixed with barium. Barium is a liquid or paste that allows images of your mouth, throat and esophagus to be seen on x-ray. The x-ray captures moving images of the food you are swallowing as it travels from your mouth through your throat and into your esophagus. This test helps identify whether food or liquid is entering your lungs (aspiration). The test also shows which part of your mouth or throat lacks strength or coordination to move the food or liquid in the right direction. This test typically takes 30 minutes to 1 hour to complete. _______________________________________________________________________  Due to recent changes in healthcare laws, you may see the results of your imaging and laboratory studies on MyChart before your provider has had a chance to review them.  We understand that in some cases there may be results that are confusing or concerning to you. Not all laboratory results come back in the same time frame and the provider may be waiting for multiple results in order to interpret others.  Please give [place] 48 hours in order for your provider to thoroughly review all the results before contacting the office for clarification of your results.   Thank you for choosing me and [facility].  Vito Cirigliano, D.O.

## 2020-08-01 NOTE — Progress Notes (Signed)
Chief Complaint: GERD, dysphagia, chronic cough   Referring Provider:     Esperanza Richters, PA-C   HPI:     Nina Holmes is a 80 y.o. female with a history of HTN, HLD, anxiety, cervical fusion 2003, referred to the [facility] for evaluation of GERD and dysphagia.  Index reflux sxs characterized as dyspepsia, cough, post nasal drip.  Otherwise denies heartburn or regurgitation.  Symptoms worse with times of stress. Was started on Protonix 20 mg/day >1 year ago with improvement in symptoms.  Dysphagia is described as a choking sensation and coughing.  Symptoms have been present since cervical spine surgery in 2003, and progressively worse lately. Has had EGD >5 years ago in [place]- no report for review but she reports having erosive esophagitis. Has been seen by ENT in [place] as well and reported vocal cord irritation. Choking also worse during times of stress. Can occur with saliva and liquids- with immediate choking/coughing with ingestion of liquids. Generally does ok with solids. Thinks she had a swallow study at some point that was abnormal.  No change with Protonix.  No history of aspiration pneumonia.  She is now afraid to eat/drink due to sxs, and has lost 10# over last few months.  No night sweats, fever, chills.  No hematochezia or melena.    Normal CBC and CMP in 02/2020.  No recent abdominal imaging for review.   Past Medical History:  Diagnosis Date  . Allergy   . Anxiety   . Cervical fusion syndrome   . GERD (gastroesophageal reflux disease)   . Hyperlipidemia   . Hypertension      Past Surgical History:  Procedure Laterality Date  . CERVICAL FUSION     Family History  Problem Relation Age of Onset  . Heart disease Mother   . Colon cancer Neg Hx   . Pancreatic cancer Neg Hx   . Esophageal cancer Neg Hx   . Liver disease Neg Hx   . Stomach cancer Neg Hx    Social History   Tobacco Use  . Smoking status: Never Smoker  .  Smokeless tobacco: Never Used  Vaping Use  . Vaping Use: Never used  Substance Use Topics  . Alcohol use: Not Currently  . Drug use: Never   Current Outpatient Medications  Medication Sig Dispense Refill  . ALPRAZolam (XANAX) 0.5 MG tablet Take 1 tablet (0.5 mg total) by mouth at bedtime as needed for anxiety or sleep. 30 tablet 1  . aspirin 81 MG chewable tablet Chew 1 tablet (81 mg total) by mouth daily. 90 tablet 3  . atorvastatin (LIPITOR) 10 MG tablet TAKE 1 TABLET(10 MG) BY MOUTH DAILY 30 tablet 2  . cholecalciferol (VITAMIN D3) 25 MCG (1000 UNIT) tablet Take 1 tablet (1,000 Units total) by mouth daily. 90 tablet 3  . hydrochlorothiazide (MICROZIDE) 12.5 MG capsule Take 1 capsule (12.5 mg total) by mouth daily. 90 capsule 3  . metoprolol tartrate (LOPRESSOR) 25 MG tablet Take 1 tablet (25 mg total) by mouth 2 (two) times daily. 180 tablet 3  . pantoprazole (PROTONIX) 20 MG tablet Take 1 tablet (20 mg total) by mouth 2 (two) times daily. 180 tablet 3  . thiamine (VITAMIN B-1) 50 MG tablet Take 1 tablet (50 mg total) by mouth daily. 90 tablet 1   No current facility-administered medications for this visit.   Allergies  Allergen Reactions  . Bentyl [Dicyclomine] Other (  See Comments)    Passed out  . Codeine Nausea Only  . Cortizone-10 [Hydrocortisone]     Burning sensation in tomach  . Sulfa Antibiotics Rash     Review of Systems: All systems reviewed and negative except where noted in HPI.     Physical Exam:    Wt Readings from Last 3 Encounters:  08/01/20 122 lb (55.3 kg)  06/27/20 116 lb 3.2 oz (52.7 kg)  03/15/20 121 lb 9.6 oz (55.2 kg)    BP 128/60   Pulse 70   Ht 5' (1.524 m)   Wt 122 lb (55.3 kg)   SpO2 98%   BMI 23.83 kg/m  Constitutional:  Pleasant, in no acute distress. Psychiatric: Normal mood and affect. Behavior is normal. EENT: Pupils normal.  Conjunctivae are normal. No scleral icterus. Neck supple. No cervical LAD. Cardiovascular: Normal rate,  regular rhythm. No edema Pulmonary/chest: Effort normal and breath sounds normal. No wheezing, rales or rhonchi. Abdominal: Soft, nondistended, nontender. Bowel sounds active throughout. There are no masses palpable. No hepatomegaly. Neurological: Alert and oriented to person place and time. Skin: Skin is warm and dry. No rashes noted.   ASSESSMENT AND PLAN;   1) Oropharyngeal Dysphagia - MBS and Speech Pathology referral - Recommended solid and at least nectar thick liquids for now and to avoid thin liquids - If unrevealing, repeat EGD +/- Bravo and/or esophagram  2) GERD with reported hx of erosive esophagitis - Not entirely sure that her symptoms are related to reflux, but she does report a history of erosive esophagitis - Attempt to get records from prior GI in [place] - Resume PPI for now - As above, could consider repeat upper endoscopy +/- Bravo study  3) History of cervical spine surgery - Evaluatin for potential GI overlap as above    Shellia Cleverly, DO, FACG  08/01/2020, 2:15 PM   [place], Nina Dredge, PA-C

## 2020-08-16 ENCOUNTER — Encounter (HOSPITAL_COMMUNITY): Payer: Medicare Other

## 2020-08-16 ENCOUNTER — Ambulatory Visit (HOSPITAL_COMMUNITY): Payer: Medicare Other

## 2020-08-18 ENCOUNTER — Telehealth: Payer: Self-pay | Admitting: Medical

## 2020-08-18 NOTE — Telephone Encounter (Signed)
Medication:ALPRAZolam (XANAX) 0.5 MG tablet [[id number]]     Has the patient contacted their pharmacy? no (If no, request that the patient contact the pharmacy for the refill.) (If yes, when and what did the pharmacy advise?)    Preferred Pharmacy (with phone number or street name):  [id number] - [place], [place] - [address] AT [facility] [address] Phone:  [phone]  Fax:  [phone]        Agent: Please be advised that RX refills may take up to 3 business days. We ask that you follow-up with your pharmacy.

## 2020-08-18 NOTE — Telephone Encounter (Addendum)
Requesting: xanax Contract:05/11/2020 UDS:06/02/2020 Last Visit:06/27/2020 Next Visit:11/01/2020 Last Refill:06/27/2020  Please Advise   Pdmd review.  Relative new pt who had been on med before established with me.  Refilling today.  Esperanza Richters, PA-C

## 2020-08-19 ENCOUNTER — Telehealth: Payer: Self-pay | Admitting: Medical

## 2020-08-19 MED ORDER — ALPRAZOLAM 0.5 MG PO TABS: 0.5000 mg | ORAL_TABLET | Freq: Every evening | ORAL | 0 refills | Status: DC | PRN

## 2020-08-19 NOTE — Telephone Encounter (Signed)
Rx xanax sent to pharmacy.

## 2020-08-22 ENCOUNTER — Ambulatory Visit (HOSPITAL_COMMUNITY)
Admission: RE | Admit: 2020-08-22 | Discharge: 2020-08-22 | Disposition: A | Discharge status: Home / Self Care | Payer: Medicare Other | Source: Ambulatory Visit | Attending: Gastroenterology | Admitting: Gastroenterology

## 2020-08-22 ENCOUNTER — Other Ambulatory Visit: Payer: Self-pay

## 2020-08-22 DIAGNOSIS — K2100 Gastro-esophageal reflux disease with esophagitis, without bleeding: Secondary | ICD-10-CM

## 2020-08-22 DIAGNOSIS — R1310 Dysphagia, unspecified: Secondary | ICD-10-CM | POA: Diagnosis not present

## 2020-08-22 DIAGNOSIS — R1312 Dysphagia, oropharyngeal phase: Secondary | ICD-10-CM

## 2020-08-22 NOTE — Progress Notes (Signed)
Modified Barium Swallow Progress Note  Patient Details  Name: Nina Holmes MRN: [id number] Date of Birth: 01/27/1941  Today's Date: 08/22/2020  Modified Barium Swallow completed.  Full report located under Chart Review in the Imaging Section.  Brief recommendations include the following:  Clinical Impression  Pt demonstrates no overt oropharyngeal impairment. There was no weakness, no penetration or aspiration, no residue. However, pt does have a cervical fusion at C4-7 running along cervical esophagus. This did not functionally impact swallow during test, but could play a role in pts cough sensitivity. Additionally pt demonstrated very cautious oral transit with one instance of trace premature spillage to pyriforms that elicited a flinch from the pt. Pt reports frequent hard coughing that can lead to a sensation of her throat closing up, which may be a laryngospasm. All of pts concerns: GERD, post nasal drip, allergies, anxiety, and prior ACDF can play a role in chronic cough. Pts self reported anxiety can also exacerbate symptoms. Coughing itself can cause inflammation and further coughing. Would recommend pt f/u with OP SLP to address chronic cough and cough suppression therapy in conjunction with medical management. No diet modification needed unless thickening gives pt comfort. Pt may benefit from pills whole in puree to reduce coughing with liquids.   Swallow Evaluation Recommendations       SLP Diet Recommendations: Regular solids;Thin liquid       Medication Administration: Whole meds with puree                       Harlon Ditty, MA CCC-SLP  Acute Rehabilitation Services Pager [phone] Office [phone]  Claudine Mouton 08/22/2020,1:26 PM

## 2020-09-01 ENCOUNTER — Other Ambulatory Visit: Payer: Self-pay

## 2020-09-01 ENCOUNTER — Telehealth: Payer: Self-pay | Admitting: Gastroenterology

## 2020-09-01 DIAGNOSIS — R1312 Dysphagia, oropharyngeal phase: Secondary | ICD-10-CM

## 2020-09-01 DIAGNOSIS — R059 Cough, unspecified: Secondary | ICD-10-CM

## 2020-09-01 NOTE — Telephone Encounter (Signed)
Pls call pt, she called inquiring about her appt with speech pathology as she has not been able to contact them. She is requesting to leave a clear message on her phone in case she does not answer the phone.

## 2020-09-01 NOTE — Progress Notes (Signed)
Referral to Outpatient speech pathology for management of cough

## 2020-09-04 NOTE — Telephone Encounter (Signed)
LVM  Speech pathology will be calling her. They have received her voicemail and she is on a list to be called back

## 2020-09-05 ENCOUNTER — Ambulatory Visit: Payer: Medicare Other

## 2020-09-05 ENCOUNTER — Other Ambulatory Visit: Payer: Self-pay

## 2020-09-05 NOTE — Therapy (Signed)
[facility] [address] [place], [place], [address] Phone: [phone]   Fax:  [phone]  Patient Details  Name: Nina Holmes MRN: [id number] Date of Birth: Nov 19, 1940 Referring Provider:  Shellia Cleverly, DO  Encounter Date: 09/05/2020    ST - ARRIVE/CANCEL  Once pt arrived and was brought back to ST room, she shared with SLP that this site's distance from her home is a barrier to care for her.   Once SLP learned this, SLP provided pt a handout with information of an outpatient site that has ST treatment which is 10-15 minutes closer to her home ([facility] Rehabilitation at [facility]).  Pt thanked SLP and SLP encouraged pt to contact this clinic once she consults with daughter for her ride, as early as this afternoon. [facility] S[facility] [place] that pt will be contacting their clinic for an appointment.   [facility] ,MS, CCC-SLP  09/05/2020, 1:51 PM   [facility] [address] [place], [place], [address] Phone: [phone]   Fax:  [phone]

## 2020-11-01 ENCOUNTER — Encounter: Payer: Self-pay | Admitting: Medical

## 2020-11-01 ENCOUNTER — Ambulatory Visit (INDEPENDENT_AMBULATORY_CARE_PROVIDER_SITE_OTHER): Payer: Medicare Other | Admitting: Medical

## 2020-11-01 ENCOUNTER — Other Ambulatory Visit: Payer: Self-pay

## 2020-11-01 VITALS — BP 116/50 | HR 76 | Temp 98.2°F | Resp 18 | Ht 60.0 in | Wt 124.0 lb

## 2020-11-01 DIAGNOSIS — R413 Other amnesia: Secondary | ICD-10-CM

## 2020-11-01 DIAGNOSIS — R5383 Other fatigue: Secondary | ICD-10-CM

## 2020-11-01 DIAGNOSIS — K219 Gastro-esophageal reflux disease without esophagitis: Secondary | ICD-10-CM

## 2020-11-01 DIAGNOSIS — E785 Hyperlipidemia, unspecified: Secondary | ICD-10-CM | POA: Diagnosis not present

## 2020-11-01 DIAGNOSIS — N390 Urinary tract infection, site not specified: Secondary | ICD-10-CM | POA: Diagnosis not present

## 2020-11-01 DIAGNOSIS — I10 Essential (primary) hypertension: Secondary | ICD-10-CM | POA: Diagnosis not present

## 2020-11-01 DIAGNOSIS — R42 Dizziness and giddiness: Secondary | ICD-10-CM

## 2020-11-01 MED ORDER — VENLAFAXINE HCL ER 37.5 MG PO CP24: 37.5000 mg | ORAL_CAPSULE | Freq: Every day | ORAL | 0 refills | Status: DC

## 2020-11-01 NOTE — Patient Instructions (Addendum)
Your blood pressure is well controlled today. Continue current hctz and lopressor.  For high cholesterol eat low cholesterol diet. You are on statin presently.  Your dizziness recently appears to be more related to changing positions. Be careful ambulating. Get balance before walking. You think statin makes you dizzy. I doubt this but if you want to hold for 7 days as trial can do so.  Uti recently. Get urine culture today.  For fatigue get labs today.  For depression and anxiety rx effexor low dose. Try to use xanax sparingly.  For memory concerns referred to neurologist.  Follow up in one month or sooner if needed

## 2020-11-01 NOTE — Progress Notes (Signed)
Subjective:    Patient ID: Nina Holmes, female    DOB: 1940-04-22, 80 y.o.   MRN: [id number]  HPI  Pt in for follow up.  Pt has history of htn, gerd and high cholesterol. Pt thinks atorvastatin might be causing dizziness. Give examples when putting on pajamas feels mild dizzy.  Some dizziness on changing positions.   She read that maybe could be atorvastatin.   She has been on atorvastatin for 2 years.  Hx of uti by culture. She did take the antibiotics. She thinks urine bit concentrated.  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  Pt daughter states mom is very stressed. Mom does not like living in [place]. Argues with some family.  She is forgetful and gets confused easily.   Hx of anxiety for years before she saw me. Has been on xanax for years.      Review of Systems  Constitutional:  Negative for chills, fatigue and unexpected weight change.  Respiratory:  Negative for cough, choking, shortness of breath and wheezing.   Cardiovascular:  Negative for chest pain and palpitations.  Gastrointestinal:  Negative for abdominal pain, blood in stool and constipation.  Genitourinary:  Negative for dysuria.  Musculoskeletal:  Negative for back pain.  Skin:  Negative for rash.  Neurological:  Positive for dizziness. Negative for seizures, syncope, weakness, numbness and headaches.  Hematological:  Negative for adenopathy. Does not bruise/bleed easily.  Psychiatric/Behavioral:  Negative for agitation, confusion, dysphoric mood, sleep disturbance and suicidal ideas. The patient is nervous/anxious.     Past Medical History:  Diagnosis Date   Allergy    Anxiety    Cervical fusion syndrome    GERD (gastroesophageal reflux disease)    Hyperlipidemia    Hypertension      Social History   Socioeconomic History   Marital status: Divorced    Spouse name: Not on file   Number of children: 2   Years of education: Not on file   Highest education level: Not on file  Occupational  History   Not on file  Tobacco Use   Smoking status: Never   Smokeless tobacco: Never  Vaping Use   Vaping Use: Never used  Substance and Sexual Activity   Alcohol use: Not Currently   Drug use: Never   Sexual activity: Not Currently  Other Topics Concern   Not on file  Social History Narrative   Not on file   Social Determinants of Health   Financial Resource Strain: Not on file  Food Insecurity: Not on file  Transportation Needs: Not on file  Physical Activity: Not on file  Stress: Not on file  Social Connections: Not on file  Intimate Partner Violence: Not on file    Past Surgical History:  Procedure Laterality Date   CERVICAL FUSION  2003   4 - 7 vertibra fusion    Family History  Problem Relation Age of Onset   Heart disease Mother    Colon cancer Neg Hx    Pancreatic cancer Neg Hx    Esophageal cancer Neg Hx    Liver disease Neg Hx    Stomach cancer Neg Hx     Allergies  Allergen Reactions   Bentyl [Dicyclomine] Other (See Comments)    Passed out   Codeine Nausea Only   Cortizone-10 [Hydrocortisone]     Burning sensation in tomach   Sulfa Antibiotics Rash    Current Outpatient Medications on File Prior to Visit  Medication Sig Dispense Refill  ALPRAZolam (XANAX) 0.5 MG tablet Take 1 tablet (0.5 mg total) by mouth at bedtime as needed for anxiety or sleep. 30 tablet 0   aspirin 81 MG chewable tablet Chew 1 tablet (81 mg total) by mouth daily. 90 tablet 3   atorvastatin (LIPITOR) 10 MG tablet TAKE 1 TABLET(10 MG) BY MOUTH DAILY 30 tablet 2   cholecalciferol (VITAMIN D3) 25 MCG (1000 UNIT) tablet Take 1 tablet (1,000 Units total) by mouth daily. 90 tablet 3   hydrochlorothiazide (MICROZIDE) 12.5 MG capsule Take 1 capsule (12.5 mg total) by mouth daily. 90 capsule 3   metoprolol tartrate (LOPRESSOR) 25 MG tablet Take 1 tablet (25 mg total) by mouth 2 (two) times daily. 180 tablet 3   pantoprazole (PROTONIX) 20 MG tablet Take 1 tablet (20 mg total) by  mouth 2 (two) times daily. 180 tablet 3   thiamine (VITAMIN B-1) 50 MG tablet Take 1 tablet (50 mg total) by mouth daily. 90 tablet 1   No current facility-administered medications on file prior to visit.    BP (!) 116/50   Pulse 76   Temp 98.2 F (36.8 C)   Resp 18   Ht 5' (1.524 m)   Wt 124 lb (56.2 kg)   SpO2 99%   BMI 24.22 kg/m       Objective:   Physical Exam  General Mental Status- Alert. General Appearance- Not in acute distress.   Skin General: Color- Normal Color. Moisture- Normal Moisture.  Neck Carotid Arteries- Normal color. Moisture- Normal Moisture. No carotid bruits. No JVD.  Chest and Lung Exam Auscultation: Breath Sounds:-Normal.  Cardiovascular Auscultation:Rythm- Regular. Murmurs & Other Heart Sounds:Auscultation of the heart reveals- No Murmurs.  Abdomen Inspection:-Inspeection Normal. Palpation/Percussion:Note:No mass. Palpation and Percussion of the abdomen reveal- Non Tender, Non Distended + BS, no rebound or guarding.   Neurologic Cranial Nerve exam:- CN III-XII intact(No nystagmus), symmetric smile. Strength:- 5/5 equal and symmetric strength both upper and lower extremities.       Assessment & Plan:   Patient Instructions  Your blood pressure is well controlled today. Continue current hctz and lopressor.  For high cholesterol eat low cholesterol diet. You are on statin presently.  Your dizziness recently appears to be more related to changing positions. Be careful ambulating. Get balance before walking. You think statin makes you dizzy. I doubt this but if you want to hold for 7 days as trial can do so.  Uti recently. Get urine culture today.  For fatigue get labs today.  For depression and anxiety rx effexor low dose. Try to use xanax sparingly.  For memory concernes referred to neurologist.  Follow up in one month or sooner if needed   Esperanza Richters, PA-C   Time spent with patient today was  42 minutes which consisted  of chart review, discussing diagnosis, work up treatment and documentation. Extra time counseling pt on mood and answering questions.

## 2020-11-02 LAB — URINE CULTURE
MICRO NUMBER:: 12341526
SPECIMEN QUALITY:: ADEQUATE

## 2020-11-02 LAB — COMPREHENSIVE METABOLIC PANEL
ALT: 14 U/L (ref 0–35)
AST: 26 U/L (ref 0–37)
Albumin: 4.2 g/dL (ref 3.5–5.2)
Alkaline Phosphatase: 63 U/L (ref 39–117)
BUN: 21 mg/dL (ref 6–23)
CO2: 33 mEq/L — ABNORMAL HIGH (ref 19–32)
Calcium: 9.5 mg/dL (ref 8.4–10.5)
Chloride: 97 mEq/L (ref 96–112)
Creatinine, Ser: 0.97 mg/dL (ref 0.40–1.20)
GFR: 55.77 mL/min — ABNORMAL LOW (ref >60.00)
Glucose, Bld: 90 mg/dL (ref 70–99)
Potassium: 4.4 mEq/L (ref 3.5–5.1)
Sodium: 138 mEq/L (ref 135–145)
Total Bilirubin: 0.5 mg/dL (ref 0.2–1.2)
Total Protein: 8.4 g/dL — ABNORMAL HIGH (ref 6.0–8.3)

## 2020-11-02 LAB — CBC WITH DIFFERENTIAL/PLATELET
Basophils Absolute: 0 10*3/uL (ref 0.0–0.1)
Basophils Relative: 0.7 % (ref 0.0–3.0)
Eosinophils Absolute: 0.1 10*3/uL (ref 0.0–0.7)
Eosinophils Relative: 1.7 % (ref 0.0–5.0)
HCT: 38.6 % (ref 36.0–46.0)
Hemoglobin: 13.1 g/dL (ref 12.0–15.0)
Lymphocytes Relative: 39.3 % (ref 12.0–46.0)
Lymphs Abs: 2.6 10*3/uL (ref 0.7–4.0)
MCHC: 34 g/dL (ref 30.0–36.0)
MCV: 95.2 fl (ref 78.0–100.0)
Monocytes Absolute: 0.9 10*3/uL (ref 0.1–1.0)
Monocytes Relative: 13.4 % — ABNORMAL HIGH (ref 3.0–12.0)
Neutro Abs: 3 10*3/uL (ref 1.4–7.7)
Neutrophils Relative %: 44.9 % (ref 43.0–77.0)
Platelets: 201 10*3/uL (ref 150.0–400.0)
RBC: 4.05 Mil/uL (ref 3.87–5.11)
RDW: 12.4 % (ref 11.5–15.5)
WBC: 6.7 10*3/uL (ref 4.0–10.5)

## 2020-11-02 LAB — TSH: TSH: 1.7 u[IU]/mL (ref 0.35–5.50)

## 2020-11-02 LAB — T4, FREE: Free T4: 0.71 ng/dL (ref 0.60–1.60)

## 2020-11-04 ENCOUNTER — Telehealth: Payer: Self-pay | Admitting: Medical

## 2020-11-04 NOTE — Addendum Note (Signed)
Addended by: Gwenevere Abbot on: 11/04/2020 12:25 PM   Modules accepted: Orders

## 2020-11-04 NOTE — Telephone Encounter (Signed)
Will you have pt follow up in 2 weeks. Want to see how doing after starting new med/effexor for mood.  Also want to get more detail on family concerns for memory loss/changes. In addition do MMSE.   Neurologist office wants more detail before they process the referral.  Notify family on that exam we need to concentrate just on mood and memory loss concerns.

## 2020-11-06 NOTE — Telephone Encounter (Signed)
Patient will pick up medication today, was scheduled to come in 11-21-20.

## 2020-11-07 ENCOUNTER — Other Ambulatory Visit: Payer: Self-pay | Admitting: Medical

## 2020-11-21 ENCOUNTER — Ambulatory Visit (INDEPENDENT_AMBULATORY_CARE_PROVIDER_SITE_OTHER): Payer: Medicare Other | Admitting: Medical

## 2020-11-21 ENCOUNTER — Ambulatory Visit: Payer: Medicare Other | Admitting: Medical

## 2020-11-21 ENCOUNTER — Other Ambulatory Visit: Payer: Self-pay

## 2020-11-21 ENCOUNTER — Encounter: Payer: Self-pay | Admitting: Medical

## 2020-11-21 VITALS — BP 140/50 | HR 93 | Resp 18 | Ht 61.0 in | Wt 122.8 lb

## 2020-11-21 DIAGNOSIS — F419 Anxiety disorder, unspecified: Secondary | ICD-10-CM | POA: Diagnosis not present

## 2020-11-21 DIAGNOSIS — F3289 Other specified depressive episodes: Secondary | ICD-10-CM

## 2020-11-21 NOTE — Patient Instructions (Addendum)
For anxiety and depression I do think trying effexor/venflaxine would be worthwhile. I do not think sedation would be major side effect but since this is concern of yours recommend take tab at night.  Memory concerns of daughters. Your mmse exam score was 27/30. With this score want to reach out with your daughters to get example of your memory issues before following thru with neurology referral.  Sometimes extreme stress can effect memory.  Follow up in 2-3 weeks or sooner if needed.

## 2020-11-21 NOTE — Progress Notes (Signed)
Subjective:    Patient ID: Nina Holmes, female    DOB: October 04, 1940, 80 y.o.   MRN: [id number]  HPI Pt in for follow up.  I had wanted her to follow up for MMSE. She had various complaints on last visit.  Memory issues mentioned at end of interview.  Pt is by herself today so can't get specific examples from family.   Pt tells me her high level of stress effects her memory. One of her children have breathing issues. Pt worries about her and pt indicates this effects her sleep. Trouble sleeping due to the stress.    Family issues/relation issues with grandaughter. Pt is sad. Cries.   Pt did not start the effexor that I prescribed for depression and anxiety.  Phq-9 and gad 7 done today. Scanned.  Review of Systems  Constitutional:  Negative for chills, fatigue and fever.  Respiratory:  Negative for cough, chest tightness, shortness of breath and wheezing.   Cardiovascular:  Negative for chest pain and palpitations.  Gastrointestinal:  Negative for abdominal pain.  Genitourinary:  Negative for dysuria.  Musculoskeletal:  Negative for back pain and joint swelling.  Neurological:  Negative for dizziness, seizures, numbness and headaches.  Hematological:  Negative for adenopathy. Does not bruise/bleed easily.  Psychiatric/Behavioral:  Positive for dysphoric mood and sleep disturbance. Negative for confusion and suicidal ideas. The patient is nervous/anxious.        Memory issues per family.    Past Medical History:  Diagnosis Date   Allergy    Anxiety    Cervical fusion syndrome    GERD (gastroesophageal reflux disease)    Hyperlipidemia    Hypertension      Social History   Socioeconomic History   Marital status: Divorced    Spouse name: Not on file   Number of children: 2   Years of education: Not on file   Highest education level: Not on file  Occupational History   Not on file  Tobacco Use   Smoking status: Never   Smokeless tobacco: Never  Vaping Use    Vaping Use: Never used  Substance and Sexual Activity   Alcohol use: Not Currently   Drug use: Never   Sexual activity: Not Currently  Other Topics Concern   Not on file  Social History Narrative   Not on file   Social Determinants of Health   Financial Resource Strain: Not on file  Food Insecurity: Not on file  Transportation Needs: Not on file  Physical Activity: Not on file  Stress: Not on file  Social Connections: Not on file  Intimate Partner Violence: Not on file    Past Surgical History:  Procedure Laterality Date   CERVICAL FUSION  2003   4 - 7 vertibra fusion    Family History  Problem Relation Age of Onset   Heart disease Mother    Colon cancer Neg Hx    Pancreatic cancer Neg Hx    Esophageal cancer Neg Hx    Liver disease Neg Hx    Stomach cancer Neg Hx     Allergies  Allergen Reactions   Bentyl [Dicyclomine] Other (See Comments)    Passed out   Codeine Nausea Only   Cortizone-10 [Hydrocortisone]     Burning sensation in tomach   Sulfa Antibiotics Rash    Current Outpatient Medications on File Prior to Visit  Medication Sig Dispense Refill   ALPRAZolam (XANAX) 0.5 MG tablet Take 1 tablet (0.5 mg total) by mouth  at bedtime as needed for anxiety or sleep. 30 tablet 0   aspirin 81 MG chewable tablet Chew 1 tablet (81 mg total) by mouth daily. 90 tablet 3   atorvastatin (LIPITOR) 10 MG tablet TAKE 1 TABLET(10 MG) BY MOUTH DAILY 30 tablet 2   cholecalciferol (VITAMIN D3) 25 MCG (1000 UNIT) tablet Take 1 tablet (1,000 Units total) by mouth daily. 90 tablet 3   hydrochlorothiazide (MICROZIDE) 12.5 MG capsule Take 1 capsule (12.5 mg total) by mouth daily. 90 capsule 3   metoprolol tartrate (LOPRESSOR) 25 MG tablet Take 1 tablet (25 mg total) by mouth 2 (two) times daily. 180 tablet 3   pantoprazole (PROTONIX) 20 MG tablet Take 1 tablet (20 mg total) by mouth 2 (two) times daily. 180 tablet 3   thiamine (VITAMIN B-1) 50 MG tablet Take 1 tablet (50 mg total)  by mouth daily. 90 tablet 1   venlafaxine XR (EFFEXOR-XR) 37.5 MG 24 hr capsule TAKE 1 CAPSULE(37.5 MG) BY MOUTH DAILY WITH BREAKFAST 30 capsule 0   No current facility-administered medications on file prior to visit.    BP (!) 140/50   Pulse 93   Resp 18   Ht 5\' 1"  (1.549 m)   Wt 122 lb 12.8 oz (55.7 kg)   SpO2 100%   BMI 23.20 kg/m       Objective:   Physical Exam  General- No acute distress. Pleasant patient but sad talking about relationship with daughter.  Neck- Full range of motion, no jvd Lungs- Clear, even and unlabored. Heart- regular rate and rhythm. Neurologic- CNII- XII grossly intact.       Assessment & Plan:   Patient Instructions  For anxiety and depression I do think trying effexor would be worthwhile. I do not thinks sedation would be major side effect but since this is concern of yours recommend take tab at night.  Memory concerns of daughters. Your mmse exam score was 27/30. With this score want to reach out with your daughters to get example of your memory issues before following thru with neurology referral.  Sometimes extreme stress can effect memory.  Follow up in 2-3 weeks or sooner if needed.   , PA-C    Time spent with patient today was  40 minutes which consisted of chart review, discussing diagnosis, work up, treatment , answering questions on med side effect concers for pt and documentation. Left exam room and patient asked me to return to room and counseled more regarding her mood and family issues.

## 2020-12-12 ENCOUNTER — Ambulatory Visit: Payer: Medicare Other | Admitting: Medical

## 2020-12-26 ENCOUNTER — Other Ambulatory Visit: Payer: Self-pay | Admitting: Medical

## 2020-12-27 NOTE — Telephone Encounter (Addendum)
Requesting: xanax Contract:03/05/2020 UDS:06/02/2020 Last Visit:11/21/20 Next Visit:n/a Last Refill:08/19/20  Please Advise   Rx sent to pt pharmacy  Esperanza Richters, PA-C

## 2021-01-23 ENCOUNTER — Emergency Department (HOSPITAL_BASED_OUTPATIENT_CLINIC_OR_DEPARTMENT_OTHER): Payer: Medicare Other

## 2021-01-23 ENCOUNTER — Other Ambulatory Visit: Payer: Self-pay

## 2021-01-23 ENCOUNTER — Emergency Department (HOSPITAL_BASED_OUTPATIENT_CLINIC_OR_DEPARTMENT_OTHER)
Admission: EM | Admit: 2021-01-23 | Discharge: 2021-01-23 | Disposition: A | Discharge status: Home / Self Care | Payer: Medicare Other | Attending: Emergency Medicine | Admitting: Emergency Medicine

## 2021-01-23 ENCOUNTER — Encounter (HOSPITAL_BASED_OUTPATIENT_CLINIC_OR_DEPARTMENT_OTHER): Payer: Self-pay | Admitting: *Deleted

## 2021-01-23 DIAGNOSIS — R197 Diarrhea, unspecified: Secondary | ICD-10-CM | POA: Diagnosis not present

## 2021-01-23 DIAGNOSIS — R109 Unspecified abdominal pain: Secondary | ICD-10-CM | POA: Diagnosis not present

## 2021-01-23 DIAGNOSIS — Z20822 Contact with and (suspected) exposure to covid-19: Secondary | ICD-10-CM | POA: Diagnosis not present

## 2021-01-23 DIAGNOSIS — E860 Dehydration: Secondary | ICD-10-CM | POA: Diagnosis not present

## 2021-01-23 DIAGNOSIS — I10 Essential (primary) hypertension: Secondary | ICD-10-CM | POA: Diagnosis not present

## 2021-01-23 DIAGNOSIS — Z7982 Long term (current) use of aspirin: Secondary | ICD-10-CM | POA: Insufficient documentation

## 2021-01-23 DIAGNOSIS — K6289 Other specified diseases of anus and rectum: Secondary | ICD-10-CM | POA: Diagnosis not present

## 2021-01-23 DIAGNOSIS — Z79899 Other long term (current) drug therapy: Secondary | ICD-10-CM | POA: Diagnosis not present

## 2021-01-23 DIAGNOSIS — K219 Gastro-esophageal reflux disease without esophagitis: Secondary | ICD-10-CM | POA: Insufficient documentation

## 2021-01-23 DIAGNOSIS — E876 Hypokalemia: Secondary | ICD-10-CM

## 2021-01-23 LAB — CBC WITH DIFFERENTIAL/PLATELET
Abs Immature Granulocytes: 0.02 10*3/uL (ref 0.00–0.07)
Basophils Absolute: 0 10*3/uL (ref 0.0–0.1)
Basophils Relative: 0 %
Eosinophils Absolute: 0.1 10*3/uL (ref 0.0–0.5)
Eosinophils Relative: 1 %
HCT: 37.5 % (ref 36.0–46.0)
Hemoglobin: 13.3 g/dL (ref 12.0–15.0)
Immature Granulocytes: 0 %
Lymphocytes Relative: 36 %
Lymphs Abs: 2.3 10*3/uL (ref 0.7–4.0)
MCH: 32.7 pg (ref 26.0–34.0)
MCHC: 35.5 g/dL (ref 30.0–36.0)
MCV: 92.1 fL (ref 80.0–100.0)
Monocytes Absolute: 0.7 10*3/uL (ref 0.1–1.0)
Monocytes Relative: 12 %
Neutro Abs: 3.2 10*3/uL (ref 1.7–7.7)
Neutrophils Relative %: 51 %
Platelets: 228 10*3/uL (ref 150–400)
RBC: 4.07 MIL/uL (ref 3.87–5.11)
RDW: 12.2 % (ref 11.5–15.5)
WBC: 6.3 10*3/uL (ref 4.0–10.5)
nRBC: 0 % (ref 0.0–0.2)

## 2021-01-23 LAB — COMPREHENSIVE METABOLIC PANEL
ALT: 30 U/L (ref 0–44)
AST: 43 U/L — ABNORMAL HIGH (ref 15–41)
Albumin: 3.9 g/dL (ref 3.5–5.0)
Alkaline Phosphatase: 58 U/L (ref 38–126)
Anion gap: 11 (ref 5–15)
BUN: 16 mg/dL (ref 8–23)
CO2: 31 mmol/L (ref 22–32)
Calcium: 8.9 mg/dL (ref 8.9–10.3)
Chloride: 93 mmol/L — ABNORMAL LOW (ref 98–111)
Creatinine, Ser: 0.91 mg/dL (ref 0.44–1.00)
GFR, Estimated: >60 mL/min (ref >60)
Glucose, Bld: 87 mg/dL (ref 70–99)
Potassium: 2.9 mmol/L — ABNORMAL LOW (ref 3.5–5.1)
Sodium: 135 mmol/L (ref 135–145)
Total Bilirubin: 0.7 mg/dL (ref 0.3–1.2)
Total Protein: 8.7 g/dL — ABNORMAL HIGH (ref 6.5–8.1)

## 2021-01-23 LAB — URINALYSIS, ROUTINE W REFLEX MICROSCOPIC
Bilirubin Urine: NEGATIVE
Glucose, UA: NEGATIVE mg/dL
Ketones, ur: NEGATIVE mg/dL
Nitrite: NEGATIVE
Protein, ur: NEGATIVE mg/dL
Specific Gravity, Urine: 1.005 (ref 1.005–1.030)
pH: 5.5 (ref 5.0–8.0)

## 2021-01-23 LAB — URINALYSIS, MICROSCOPIC (REFLEX)

## 2021-01-23 LAB — RESP PANEL BY RT-PCR (FLU A&B, COVID) ARPGX2
Influenza A by PCR: NEGATIVE
Influenza B by PCR: NEGATIVE
SARS Coronavirus 2 by RT PCR: NEGATIVE

## 2021-01-23 LAB — LIPASE, BLOOD: Lipase: 31 U/L (ref 11–51)

## 2021-01-23 MED ORDER — POTASSIUM CHLORIDE ER 10 MEQ PO TBCR: 20.0000 meq | EXTENDED_RELEASE_TABLET | Freq: Two times a day (BID) | ORAL | 0 refills | Status: DC

## 2021-01-23 MED ORDER — POTASSIUM CHLORIDE 10 MEQ/100ML IV SOLN
10.0000 meq | INTRAVENOUS | Status: AC
  Administered 2021-01-23 (×2): 10 meq via INTRAVENOUS

## 2021-01-23 MED ORDER — POTASSIUM CHLORIDE 20 MEQ PO PACK
40.0000 meq | PACK | Freq: Two times a day (BID) | ORAL | Status: DC
  Administered 2021-01-23: 40 meq via ORAL

## 2021-01-23 MED ORDER — LOPERAMIDE HCL 2 MG PO CAPS: 2.0000 mg | ORAL_CAPSULE | Freq: Four times a day (QID) | ORAL | 0 refills | Status: DC | PRN

## 2021-01-23 MED ORDER — ONDANSETRON HCL 4 MG PO TABS: 4.0000 mg | ORAL_TABLET | Freq: Four times a day (QID) | ORAL | 0 refills | Status: DC

## 2021-01-23 MED ORDER — POTASSIUM CHLORIDE ER 10 MEQ PO CPCR: 20.0000 meq | ORAL_CAPSULE | Freq: Two times a day (BID) | ORAL | 0 refills | Status: DC

## 2021-01-23 MED ORDER — LACTATED RINGERS IV BOLUS
1000.0000 mL | Freq: Once | INTRAVENOUS | Status: AC
  Administered 2021-01-23: 1000 mL via INTRAVENOUS

## 2021-01-23 MED ORDER — MAGNESIUM SULFATE 2 GM/50ML IV SOLN
2.0000 g | Freq: Once | INTRAVENOUS | Status: AC
  Administered 2021-01-23: 2 g via INTRAVENOUS

## 2021-01-23 MED ORDER — POTASSIUM CHLORIDE CRYS ER 20 MEQ PO TBCR: 40.0000 meq | EXTENDED_RELEASE_TABLET | Freq: Once | ORAL | Status: DC

## 2021-01-23 MED ORDER — ONDANSETRON HCL 4 MG/2ML IJ SOLN
4.0000 mg | Freq: Once | INTRAMUSCULAR | Status: AC
  Administered 2021-01-23: 4 mg via INTRAVENOUS

## 2021-01-23 MED ORDER — IOHEXOL 300 MG/ML  SOLN
100.0000 mL | Freq: Once | INTRAMUSCULAR | Status: AC | PRN
  Administered 2021-01-23: 100 mL via INTRAVENOUS

## 2021-01-23 NOTE — ED Triage Notes (Signed)
Abdominal pain and diarrhea x 4 days. Covid exposure.

## 2021-01-23 NOTE — ED Provider Notes (Signed)
[facility] EMERGENCY DEPARTMENT Provider Note   CSN: [id number] Arrival date & time: 01/23/21  1327     History Chief Complaint  Patient presents with   Abdominal Pain    Nina Holmes is a 80 y.o. female.  HPI    Friday evening began to have diarrhea, had it 36 times Friday night.  Rectum sore after wiping so much, using witch hazel.  Severe rectal pain.  Then yesterday was feeling a little better, than this morning having diarrhea again, 7 times this AM.  Has barely eaten over the whole weekend.    3 weeks ago family had COVID< she wasn't tested but was sick at the time cough etc.    Severe fatigue, generalized weakness Was feeling weak today, worried about fainting Nausea,. No vomiting Did have abdominal pain earlier, midday Sister just got out of the hospital, diagnosed with pancreatic cancer, has been very difficult   Past Medical History:  Diagnosis Date   Allergy    Anxiety    Cervical fusion syndrome    GERD (gastroesophageal reflux disease)    Hyperlipidemia    Hypertension     There are no problems to display for this patient.   Past Surgical History:  Procedure Laterality Date   CERVICAL FUSION  2003   4 - 7 vertibra fusion     OB History   No obstetric history on file.     Family History  Problem Relation Age of Onset   Heart disease Mother    Colon cancer Neg Hx    Pancreatic cancer Neg Hx    Esophageal cancer Neg Hx    Liver disease Neg Hx    Stomach cancer Neg Hx     Social History   Tobacco Use   Smoking status: Never   Smokeless tobacco: Never  Vaping Use   Vaping Use: Never used  Substance Use Topics   Alcohol use: Not Currently   Drug use: Never    Home Medications Prior to Admission medications   Medication Sig Start Date End Date Taking? Authorizing Provider  ALPRAZolam (XANAX) 0.5 MG tablet TAKE 1 TABLET(0.5 MG) BY MOUTH AT BEDTIME AS NEEDED FOR ANXIETY OR SLEEP 12/27/20   Saguier, Ramon Dredge, PA-C   aspirin 81 MG chewable tablet Chew 1 tablet (81 mg total) by mouth daily. 03/17/20   Saguier, Ramon Dredge, PA-C  atorvastatin (LIPITOR) 10 MG tablet TAKE 1 TABLET(10 MG) BY MOUTH DAILY 06/16/20   Saguier, Ramon Dredge, PA-C  cholecalciferol (VITAMIN D3) 25 MCG (1000 UNIT) tablet Take 1 tablet (1,000 Units total) by mouth daily. 03/17/20   Saguier, Ramon Dredge, PA-C  hydrochlorothiazide (MICROZIDE) 12.5 MG capsule Take 1 capsule (12.5 mg total) by mouth daily. 03/17/20   Saguier, Ramon Dredge, PA-C  metoprolol tartrate (LOPRESSOR) 25 MG tablet Take 1 tablet (25 mg total) by mouth 2 (two) times daily. 03/17/20   Saguier, Ramon Dredge, PA-C  pantoprazole (PROTONIX) 20 MG tablet Take 1 tablet (20 mg total) by mouth 2 (two) times daily. 05/10/20   Saguier, Ramon Dredge, PA-C  thiamine (VITAMIN B-1) 50 MG tablet Take 1 tablet (50 mg total) by mouth daily. 03/17/20   Saguier, Ramon Dredge, PA-C  venlafaxine XR (EFFEXOR-XR) 37.5 MG 24 hr capsule TAKE 1 CAPSULE(37.5 MG) BY MOUTH DAILY WITH BREAKFAST 11/07/20   Saguier, Ramon Dredge, PA-C    Allergies    Bentyl [dicyclomine], Codeine, Cortizone-10 [hydrocortisone], and Sulfa antibiotics  Review of Systems   Review of Systems  Constitutional:  Positive for appetite change and fatigue. Negative  for fever.  HENT:  Negative for sore throat.   Eyes:  Negative for visual disturbance.  Respiratory:  Positive for cough. Negative for shortness of breath.   Cardiovascular:  Negative for chest pain.  Gastrointestinal:  Positive for abdominal pain, diarrhea and nausea. Negative for constipation and vomiting.  Genitourinary:  Negative for difficulty urinating.  Musculoskeletal:  Negative for back pain and neck pain.  Skin:  Negative for rash.  Neurological:  Positive for light-headedness. Negative for syncope and headaches.   Physical Exam Updated Vital Signs BP (!) 124/52   Pulse 76   Temp 98 F (36.7 C)   Resp 13   Ht 5' (1.524 m)   Wt 55.7 kg   SpO2 98%   BMI 23.98 kg/m   Physical Exam Vitals and  nursing note reviewed.  Constitutional:      General: She is not in acute distress.    Appearance: She is well-developed. She is not diaphoretic.  HENT:     Head: Normocephalic and atraumatic.  Eyes:     Conjunctiva/sclera: Conjunctivae normal.  Cardiovascular:     Rate and Rhythm: Normal rate and regular rhythm.     Heart sounds: Normal heart sounds. No murmur heard.   No friction rub. No gallop.  Pulmonary:     Effort: Pulmonary effort is normal. No respiratory distress.     Breath sounds: Normal breath sounds. No wheezing or rales.  Abdominal:     General: There is no distension.     Palpations: Abdomen is soft.     Tenderness: There is no abdominal tenderness. There is no guarding.  Musculoskeletal:        General: No tenderness.     Cervical back: Normal range of motion.  Skin:    General: Skin is warm and dry.     Findings: No erythema or rash.  Neurological:     Mental Status: She is alert and oriented to person, place, and time.    ED Results / Procedures / Treatments   Labs (all labs ordered are listed, but only abnormal results are displayed) Labs Reviewed  COMPREHENSIVE METABOLIC PANEL - Abnormal; Notable for the following components:      Result Value   Potassium 2.9 (*)    Chloride 93 (*)    Total Protein 8.7 (*)    AST 43 (*)    All other components within normal limits  URINALYSIS, ROUTINE W REFLEX MICROSCOPIC - Abnormal; Notable for the following components:   Hgb urine dipstick SMALL (*)    Leukocytes,Ua SMALL (*)    All other components within normal limits  URINALYSIS, MICROSCOPIC (REFLEX) - Abnormal; Notable for the following components:   Bacteria, UA RARE (*)    All other components within normal limits  RESP PANEL BY RT-PCR (FLU A&B, COVID) ARPGX2  GASTROINTESTINAL PANEL BY PCR, STOOL (REPLACES STOOL CULTURE)  C DIFFICILE QUICK SCREEN W PCR REFLEX    CBC WITH DIFFERENTIAL/PLATELET  LIPASE, BLOOD    EKG None  Radiology DG Chest 2  View  Result Date: 01/23/2021 CLINICAL DATA:  Cough EXAM: CHEST - 2 VIEW COMPARISON:  None. FINDINGS: Heart size and mediastinal contours are within normal limits. No suspicious pulmonary opacities identified. No pleural effusion or pneumothorax visualized. No acute osseous abnormality appreciated. IMPRESSION: No acute intrathoracic process identified. Electronically Signed   By: Ofilia Neas M.D.   On: 01/23/2021 17:44   CT ABDOMEN PELVIS W CONTRAST  Result Date: 01/23/2021 CLINICAL DATA:  Acute abdominal pain,  diarrhea. Evaluate for diverticulitis. EXAM: CT ABDOMEN AND PELVIS WITH CONTRAST TECHNIQUE: Multidetector CT imaging of the abdomen and pelvis was performed using the standard protocol following bolus administration of intravenous contrast. CONTRAST:  143mL OMNIPAQUE IOHEXOL 300 MG/ML  SOLN COMPARISON:  None. FINDINGS: Lower chest: No acute abnormality. Hepatobiliary: No focal liver abnormality is seen. No gallstones, gallbladder wall thickening, or biliary dilatation. Pancreas: Unremarkable. No pancreatic ductal dilatation or surrounding inflammatory changes. Spleen: Normal in size without focal abnormality. Adrenals/Urinary Tract: Adrenal glands are unremarkable. Kidneys are normal, without renal calculi, focal lesion, or hydronephrosis. Bladder is unremarkable. Stomach/Bowel: Stomach is within normal limits. Appendix appears normal. No evidence of bowel wall thickening, distention, or inflammatory changes. There is diffuse colonic diverticulosis without evidence for acute diverticulitis. Vascular/Lymphatic: Aortic atherosclerosis. No enlarged abdominal or pelvic lymph nodes. Reproductive: Uterus and bilateral adnexa are unremarkable. Other: No abdominal wall hernia or abnormality. No abdominopelvic ascites. Musculoskeletal: Multilevel degenerative changes affect the spine IMPRESSION: 1. No acute localizing process in the abdomen or pelvis. 2. Diffuse colonic diverticulosis without evidence  for acute diverticulitis. Electronically Signed   By: Ronney Asters M.D.   On: 01/23/2021 18:50    Procedures Procedures   Medications Ordered in ED Medications  potassium chloride 10 mEq in 100 mL IVPB (10 mEq Intravenous New Bag/Given 01/23/21 1817)  potassium chloride SA (KLOR-CON M) CR tablet 40 mEq (has no administration in time range)  magnesium sulfate IVPB 2 g 50 mL (has no administration in time range)  lactated ringers bolus 1,000 mL (1,000 mLs Intravenous New Bag/Given 01/23/21 1628)  ondansetron (ZOFRAN) injection 4 mg (4 mg Intravenous Given 01/23/21 1627)  iohexol (OMNIPAQUE) 300 MG/ML solution 100 mL (100 mLs Intravenous Contrast Given 01/23/21 1729)    ED Course  I have reviewed the triage vital signs and the nursing notes.  Pertinent labs & imaging results that were available during my care of the patient were reviewed by me and considered in my medical decision making (see chart for details).    MDM Rules/Calculators/A&P                            80 year old female with a history hypertension, hyperlipidemia, presents with concern for diarrhea and abdominal pain.  CT abdomen pelvis was obtained which shows no acute localizing process, diffuse diverticulosis without diverticulitis.  Chest x-ray done given history of cough shows no evidence of pneumonia, and suspect this is recovering cough from previous viral infection.  Today, COVID and influenza testing is negative.  There are no signs of pancreatitis.  She described having significant diarrhea, and has hypokalemia with a potassium of 2.9, however no other significant electrolyte abnormalities or acute kidney injury.  Labs show normal hemoglobin of 13.3.  No sign of urinary tract infection.  Denies medication changes that could have led to this.  Suspect most likely viral infection as etiology of nausea, abdominal pain and diarrhea.  Have low suspicion for C. difficile given no recent antibiotics, no leukocytosis.   Will give prescription for Imodium, Zofran, and potassium.  Recommend continued supportive care and follow-up with a primary care physician.  Final Clinical Impression(s) / ED Diagnoses Final diagnoses:  Diarrhea of presumed infectious origin  Dehydration  Hypokalemia    Rx / DC Orders ED Discharge Orders     None        Gareth Morgan, MD 01/24/21 [phone]

## 2021-01-23 NOTE — ED Notes (Signed)
ED Provider at bedside. 

## 2021-01-24 ENCOUNTER — Telehealth: Payer: Self-pay | Admitting: Medical

## 2021-01-24 NOTE — Telephone Encounter (Signed)
Prescriptions are: 1-Potassium cl 10 meq capsules/ take 2 capsules by mouth twice daly for 3 days.   2-Potassium cl 10 meq er tablets/ take 2 tablets by mouth twice daily for 3 days.

## 2021-01-24 NOTE — Telephone Encounter (Signed)
Patient advised per Esperanza Richters start taking as indicated 20 mg bid (either capsules or tablets) starting today and come in on Monday for follow up and labs.   This conversation was in spanish.

## 2021-01-24 NOTE — Telephone Encounter (Signed)
Pt. Called in and stated she was seen at the ER for a stomach virus. Due to the diarrhea she stated they prescribed her potassium. She said that she has two kinds of potassium medication. One is a tablet and the other is a capsule. She said that she would like to know which one to take.

## 2021-01-25 NOTE — Telephone Encounter (Signed)
Pt. Called and stated since shes been taking the potassium shes had bad stomach pain. She wants to do know what to do to ease pain and does she need to stop the medication?

## 2021-01-29 ENCOUNTER — Other Ambulatory Visit (INDEPENDENT_AMBULATORY_CARE_PROVIDER_SITE_OTHER): Payer: Medicare Other

## 2021-01-29 ENCOUNTER — Other Ambulatory Visit: Payer: Self-pay

## 2021-01-29 ENCOUNTER — Ambulatory Visit: Payer: Medicare Other | Admitting: Medical

## 2021-01-29 DIAGNOSIS — E876 Hypokalemia: Secondary | ICD-10-CM | POA: Diagnosis not present

## 2021-01-29 LAB — BASIC METABOLIC PANEL
BUN: 15 mg/dL (ref 6–23)
CO2: 35 mEq/L — ABNORMAL HIGH (ref 19–32)
Calcium: 9.7 mg/dL (ref 8.4–10.5)
Chloride: 97 mEq/L (ref 96–112)
Creatinine, Ser: 1.02 mg/dL (ref 0.40–1.20)
GFR: 52.41 mL/min — ABNORMAL LOW (ref >60.00)
Glucose, Bld: 88 mg/dL (ref 70–99)
Potassium: 3.9 mEq/L (ref 3.5–5.1)
Sodium: 138 mEq/L (ref 135–145)

## 2021-01-29 NOTE — Telephone Encounter (Signed)
Not able to try to reach patient until today due to not being in office Friday. She is scheduled to come in this afternoon. Per Provider we will get BMP today to check on Potassium levels. Patient is not answering the phone at this time.

## 2021-01-29 NOTE — Addendum Note (Signed)
Addended by: Wilford Corner on: 01/29/2021 10:07 AM   Modules accepted: Orders

## 2021-01-29 NOTE — Progress Notes (Signed)
cm

## 2021-02-04 ENCOUNTER — Other Ambulatory Visit: Payer: Self-pay | Admitting: Medical

## 2021-02-05 NOTE — Telephone Encounter (Addendum)
Requesting: xanax Contract: n/a UDS:06/02/20 Last Visit:11/21/20 Next Visit:n/a Last Refill:12/27/20  Please Advise   I saw in epic states  "06/02/2020 UDS sample given. CS, RMA Contract - 02/2020 0 HG"   I refilled med. Looks like she needs contract update this coming January. Please schedule for both uds and contract update.  Esperanza Richters, PA-C

## 2021-03-14 ENCOUNTER — Ambulatory Visit (INDEPENDENT_AMBULATORY_CARE_PROVIDER_SITE_OTHER): Payer: Medicare Other | Admitting: Medical

## 2021-03-14 VITALS — BP 126/57 | HR 80 | Resp 18 | Ht 61.0 in | Wt 118.4 lb

## 2021-03-14 DIAGNOSIS — M2550 Pain in unspecified joint: Secondary | ICD-10-CM

## 2021-03-14 DIAGNOSIS — M79642 Pain in left hand: Secondary | ICD-10-CM

## 2021-03-14 DIAGNOSIS — M545 Low back pain, unspecified: Secondary | ICD-10-CM | POA: Diagnosis not present

## 2021-03-14 DIAGNOSIS — M79641 Pain in right hand: Secondary | ICD-10-CM

## 2021-03-14 DIAGNOSIS — M65331 Trigger finger, right middle finger: Secondary | ICD-10-CM | POA: Diagnosis not present

## 2021-03-14 MED ORDER — DICLOFENAC SODIUM 1 % EX GEL: 4.0000 g | Freq: Four times a day (QID) | CUTANEOUS | 1 refills | Status: DC

## 2021-03-14 NOTE — Progress Notes (Addendum)
Subjective:    Patient ID: Nina Holmes, female    DOB: 09-Nov-1940, 81 y.o.   MRN: [id number]  HPI  Pt in with bilateral hand pain. Pain is worse on rt hand. She states she was having swelling. Pain was more at night.   She states some pain that has radiated up to elbow from rt hand at time.  She has had pain for years in hand but worse last month.  She also notes some rt 3rd digit triggering.   Pt has been using motrin. She tries not to overuse as she wants to avoid gi side effects. She has tried tyelenol with some success/less pain.    Also having some low back pain that feels worse when bending over and when washing dishes.  Hx of anxiety and insomnia. Pt uses xanax sparingly. Pt gave uds and should be up to  date on contact as well.    Review of Systems  Constitutional:  Negative for chills, fatigue and fever.  Respiratory:  Negative for cough, chest tightness, shortness of breath and wheezing.   Cardiovascular:  Negative for chest pain and palpitations.  Gastrointestinal:  Negative for abdominal pain, constipation, diarrhea, nausea and rectal pain.  Musculoskeletal:  Positive for back pain.       Hand pain.  Neurological:  Negative for dizziness, numbness and headaches.  Hematological:  Negative for adenopathy. Does not bruise/bleed easily.  Psychiatric/Behavioral:  Negative for behavioral problems, confusion and dysphoric mood.    Past Medical History:  Diagnosis Date   Allergy    Anxiety    Cervical fusion syndrome    GERD (gastroesophageal reflux disease)    Hyperlipidemia    Hypertension      Social History   Socioeconomic History   Marital status: Divorced    Spouse name: Not on file   Number of children: 2   Years of education: Not on file   Highest education level: Not on file  Occupational History   Not on file  Tobacco Use   Smoking status: Never   Smokeless tobacco: Never  Vaping Use   Vaping Use: Never used  Substance and Sexual  Activity   Alcohol use: Not Currently   Drug use: Never   Sexual activity: Not Currently  Other Topics Concern   Not on file  Social History Narrative   Not on file   Social Determinants of Health   Financial Resource Strain: Not on file  Food Insecurity: Not on file  Transportation Needs: Not on file  Physical Activity: Not on file  Stress: Not on file  Social Connections: Not on file  Intimate Partner Violence: Not on file    Past Surgical History:  Procedure Laterality Date   CERVICAL FUSION  2003   4 - 7 vertibra fusion    Family History  Problem Relation Age of Onset   Heart disease Mother    Colon cancer Neg Hx    Pancreatic cancer Neg Hx    Esophageal cancer Neg Hx    Liver disease Neg Hx    Stomach cancer Neg Hx     Allergies  Allergen Reactions   Bentyl [Dicyclomine] Other (See Comments)    Passed out   Codeine Nausea Only   Cortizone-10 [Hydrocortisone]     Burning sensation in tomach   Sulfa Antibiotics Rash    Current Outpatient Medications on File Prior to Visit  Medication Sig Dispense Refill   ALPRAZolam (XANAX) 0.5 MG tablet TAKE 1 TABLET(0.5  MG) BY MOUTH AT BEDTIME AS NEEDED FOR ANXIETY OR SLEEP 30 tablet 0   aspirin 81 MG chewable tablet Chew 1 tablet (81 mg total) by mouth daily. 90 tablet 3   atorvastatin (LIPITOR) 10 MG tablet TAKE 1 TABLET(10 MG) BY MOUTH DAILY 30 tablet 2   cholecalciferol (VITAMIN D3) 25 MCG (1000 UNIT) tablet Take 1 tablet (1,000 Units total) by mouth daily. 90 tablet 3   hydrochlorothiazide (MICROZIDE) 12.5 MG capsule Take 1 capsule (12.5 mg total) by mouth daily. 90 capsule 3   loperamide (IMODIUM) 2 MG capsule Take 1 capsule (2 mg total) by mouth 4 (four) times daily as needed for diarrhea or loose stools. 12 capsule 0   metoprolol tartrate (LOPRESSOR) 25 MG tablet Take 1 tablet (25 mg total) by mouth 2 (two) times daily. 180 tablet 3   pantoprazole (PROTONIX) 20 MG tablet Take 1 tablet (20 mg total) by mouth 2 (two)  times daily. 180 tablet 3   No current facility-administered medications on file prior to visit.    BP (!) 126/57    Pulse 80    Resp 18    Ht 5\' 1"  (1.549 m)    Wt 118 lb 6.4 oz (53.7 kg)    SpO2 98%    BMI 22.37 kg/m        Objective:   Physical Exam  General- No acute distress. Pleasant patient. Neck- Full range of motion, no jvd Lungs- Clear, even and unlabored. Heart- regular rate and rhythm. Neurologic- CNII- XII grossly intact.  Hand- degenerative changes to both hands. More right side. Dip joints on rt hand mild more swollen.  Rt 3rd digit on flexions gets stuck/trigger finger.  Abdomen Inspection:-Inspection Normal.  Palpation/Perucssion: Palpation and Percussion of the abdomen reveal- Non Tender, No Rebound tenderness, No rigidity(Guarding) and No Palpable abdominal masses.  Liver:-Normal.  Spleen:- Normal.   Back Mid lumbar spine tenderness to palpation. Pain on straight leg lift. Pain on lateral movements and flexion/extension of the spine.  Lower ext neurologic  L5-S1 sensation intact bilaterally. Normal patellar reflexes bilaterally. No foot drop bilaterally.       Assessment & Plan:   Patient Instructions  For bilateral hand pain will get xray's of both hands. Also get inflammatory labs. Will rx voltaren gel. You can also supplement with tylenol.   Will get inflammatory labs.  Refer to hand specialist for trigger finger.  For anxiety can continue xanax sparingly.  For low back pain will get xray and decide on further medication needed. Would recommend trying to get support belch when cleaning house of bending over.  Follow up 2-3 weeks or sooner if needed.    , PA-C   Time spent with patient today was 30  minutes which consisted of chart revdiew, discussing diagnosis, work up treatment and documentation. Answerng all of pt questions.

## 2021-03-14 NOTE — Patient Instructions (Addendum)
For bilateral hand pain will get xray's of both hands. Also get inflammatory labs. Will rx voltaren gel. You can also supplement with tylenol.   Will get inflammatory labs.  Refer to hand specialist for trigger finger.  For anxiety can continue xanax sparingly.  For low back pain will get xray and decide on further medication needed. Would recommend trying to get support belch when cleaning house of bending over.  Follow up 2-3 weeks or sooner if needed.  For dry skin on back recommend aveeno or lubriderm.

## 2021-03-20 ENCOUNTER — Telehealth (HOSPITAL_BASED_OUTPATIENT_CLINIC_OR_DEPARTMENT_OTHER): Payer: Self-pay

## 2021-03-26 ENCOUNTER — Telehealth: Payer: Self-pay | Admitting: Medical

## 2021-03-26 NOTE — Telephone Encounter (Signed)
Patient states her diclofenac Sodium (VOLTAREN) 1 % GEL  is too expensive and would like to get something else. Please advise.

## 2021-03-26 NOTE — Telephone Encounter (Signed)
Lvm for patient to call back about this message.   This message was left in spanish

## 2021-03-27 NOTE — Telephone Encounter (Signed)
Patient advised to try to get otc voltaren and take with tylenol as indicated. She verbalized understanding and will try to go get the medication tomorrow.   This conversation was in spanish.

## 2021-03-28 ENCOUNTER — Ambulatory Visit (HOSPITAL_BASED_OUTPATIENT_CLINIC_OR_DEPARTMENT_OTHER)
Admission: RE | Admit: 2021-03-28 | Discharge: 2021-03-28 | Disposition: A | Discharge status: Home / Self Care | Payer: Medicare Other | Source: Ambulatory Visit | Attending: Medical | Admitting: Medical

## 2021-03-28 ENCOUNTER — Other Ambulatory Visit (INDEPENDENT_AMBULATORY_CARE_PROVIDER_SITE_OTHER): Payer: Medicare Other

## 2021-03-28 ENCOUNTER — Other Ambulatory Visit: Payer: Self-pay

## 2021-03-28 DIAGNOSIS — M79642 Pain in left hand: Secondary | ICD-10-CM | POA: Insufficient documentation

## 2021-03-28 DIAGNOSIS — M79641 Pain in right hand: Secondary | ICD-10-CM | POA: Insufficient documentation

## 2021-03-28 DIAGNOSIS — M2550 Pain in unspecified joint: Secondary | ICD-10-CM | POA: Diagnosis not present

## 2021-03-28 DIAGNOSIS — M545 Low back pain, unspecified: Secondary | ICD-10-CM | POA: Diagnosis present

## 2021-03-28 NOTE — Addendum Note (Signed)
Addended by: Kelle Darting A on: 03/28/2021 03:40 PM   Modules accepted: Orders

## 2021-03-29 LAB — URIC ACID: Uric Acid, Serum: 6.5 mg/dL (ref 2.4–7.0)

## 2021-03-29 LAB — C-REACTIVE PROTEIN: CRP: <1 mg/dL (ref 0.5–20.0)

## 2021-03-29 LAB — SEDIMENTATION RATE: Sed Rate: 29 mm/h (ref 0–30)

## 2021-03-30 LAB — RHEUMATOID FACTOR: Rheumatoid fact SerPl-aCnc: <14 IU/mL (ref <14)

## 2021-03-30 LAB — ANA: Anti Nuclear Antibody (ANA): NEGATIVE

## 2021-04-02 ENCOUNTER — Ambulatory Visit: Payer: Medicare Other | Admitting: Medical

## 2021-04-03 ENCOUNTER — Ambulatory Visit (INDEPENDENT_AMBULATORY_CARE_PROVIDER_SITE_OTHER): Payer: Medicare Other | Admitting: Medical

## 2021-04-03 ENCOUNTER — Telehealth: Payer: Self-pay | Admitting: Medical

## 2021-04-03 VITALS — BP 129/52 | HR 75 | Temp 98.3°F | Resp 16 | Ht 60.0 in | Wt 119.4 lb

## 2021-04-03 DIAGNOSIS — M545 Low back pain, unspecified: Secondary | ICD-10-CM

## 2021-04-03 DIAGNOSIS — R1310 Dysphagia, unspecified: Secondary | ICD-10-CM | POA: Diagnosis not present

## 2021-04-03 DIAGNOSIS — M899 Disorder of bone, unspecified: Secondary | ICD-10-CM | POA: Diagnosis not present

## 2021-04-03 DIAGNOSIS — R739 Hyperglycemia, unspecified: Secondary | ICD-10-CM

## 2021-04-03 NOTE — Progress Notes (Addendum)
Subjective:    Patient ID: Nina Holmes, female    DOB: [birth year], 81 y.o.   MRN: [id number]  HPI  Pt in to day to discuss xray of spine.  Radiologist impression. 1. Lucent lesion in the L4 vertebral body was not present on the prior CT from 01/23/2021. This may reflect metastatic disease or a multiple myeloma lesion. Recommend MRI of the lumbar spine with and without contrast for further evaluation. 2. Mild multilevel degenerative changes as above.  Pt will take 2 tabs of xanax 30 minutes before her mri. Pt does have metal plate in her back.  She states has back pain that persists. Describes more sciatic type pain presently. Pt to rt si area and run to back of leg both side but much worse rt side. No leg weakness.  Pt is prediabetic. She checks her sugars at home. Pt not sure which brand she uses. Last time checked sugar was 100-110 range.  Also some chronic difficulty swallowing. Sometimes coughs and chokes easily on liquids. Remote hx of heart burn. Negative swallowing study. Pt was referred to speech pathologist. Pt never went to that appt?  Review of Systems  Constitutional:  Negative for chills, fatigue and fever.  Respiratory:  Negative for cough, chest tightness, shortness of breath and wheezing.   Cardiovascular:  Negative for chest pain and palpitations.  Gastrointestinal:  Negative for abdominal distention, anal bleeding, constipation, nausea and vomiting.  Genitourinary:  Negative for dysuria.  Musculoskeletal:  Negative for back pain, joint swelling and myalgias.        Si area pain.  Skin:  Negative for rash.  Neurological:  Negative for dizziness, numbness and headaches.  Hematological:  Negative for adenopathy. Does not bruise/bleed easily.  Psychiatric/Behavioral:  Negative for behavioral problems and dysphoric mood.     Past Medical History:  Diagnosis Date   Allergy    Anxiety    Cervical fusion syndrome    GERD (gastroesophageal reflux disease)     Hyperlipidemia    Hypertension      Social History   Socioeconomic History   Marital status: Divorced    Spouse name: Not on file   Number of children: 2   Years of education: Not on file   Highest education level: Not on file  Occupational History   Not on file  Tobacco Use   Smoking status: Never   Smokeless tobacco: Never  Vaping Use   Vaping Use: Never used  Substance and Sexual Activity   Alcohol use: Not Currently   Drug use: Never   Sexual activity: Not Currently  Other Topics Concern   Not on file  Social History Narrative   Not on file   Social Determinants of Health   Financial Resource Strain: Not on file  Food Insecurity: Not on file  Transportation Needs: Not on file  Physical Activity: Not on file  Stress: Not on file  Social Connections: Not on file  Intimate Partner Violence: Not on file    Past Surgical History:  Procedure Laterality Date   CERVICAL FUSION  2003   4 - 7 vertibra fusion    Family History  Problem Relation Age of Onset   Heart disease Mother    Colon cancer Neg Hx    Pancreatic cancer Neg Hx    Esophageal cancer Neg Hx    Liver disease Neg Hx    Stomach cancer Neg Hx     Allergies  Allergen Reactions   Bentyl [Dicyclomine] Other (  See Comments)    Passed out   Codeine Nausea Only   Cortizone-10 [Hydrocortisone]     Burning sensation in tomach   Sulfa Antibiotics Rash    Current Outpatient Medications on File Prior to Visit  Medication Sig Dispense Refill   ALPRAZolam (XANAX) 0.5 MG tablet TAKE 1 TABLET(0.5 MG) BY MOUTH AT BEDTIME AS NEEDED FOR ANXIETY OR SLEEP 30 tablet 0   aspirin 81 MG chewable tablet Chew 1 tablet (81 mg total) by mouth daily. 90 tablet 3   atorvastatin (LIPITOR) 10 MG tablet TAKE 1 TABLET(10 MG) BY MOUTH DAILY 30 tablet 2   cholecalciferol (VITAMIN D3) 25 MCG (1000 UNIT) tablet Take 1 tablet (1,000 Units total) by mouth daily. 90 tablet 3   diclofenac Sodium (VOLTAREN) 1 % GEL Apply 4 g  topically 4 (four) times daily. 150 g 1   hydrochlorothiazide (MICROZIDE) 12.5 MG capsule Take 1 capsule (12.5 mg total) by mouth daily. 90 capsule 3   loperamide (IMODIUM) 2 MG capsule Take 1 capsule (2 mg total) by mouth 4 (four) times daily as needed for diarrhea or loose stools. 12 capsule 0   metoprolol tartrate (LOPRESSOR) 25 MG tablet Take 1 tablet (25 mg total) by mouth 2 (two) times daily. 180 tablet 3   pantoprazole (PROTONIX) 20 MG tablet Take 1 tablet (20 mg total) by mouth 2 (two) times daily. 180 tablet 3   No current facility-administered medications on file prior to visit.    BP (!) 129/52 (BP Location: Right Arm, Patient Position: Sitting, Cuff Size: Small)    Pulse 75    Temp 98.3 F (36.8 C) (Oral)    Resp 16    Ht 5' (1.524 m)    Wt 119 lb 6.4 oz (54.2 kg)    SpO2 100%    BMI 23.32 kg/m       Objective:   Physical Exam  General Appearance- Not in acute distress.    Chest and Lung Exam Auscultation: Breath sounds:-Normal. Clear even and unlabored. Adventitious sounds:- No Adventitious sounds.  Cardiovascular Auscultation:Rythm - Regular, rate and rythm. Heart Sounds -Normal heart sounds.  Abdomen Inspection:-Inspection Normal.  Palpation/Perucssion: Palpation and Percussion of the abdomen reveal- Non Tender, No Rebound tenderness, No rigidity(Guarding) and No Palpable abdominal masses.  Liver:-Normal.  Spleen:- Normal.   Back Rt side lumbar spine tenderness to palpation. Si tenderness.   Lower ext neurologic  L5-S1 sensation intact bilaterally. Normal patellar reflexes bilaterally. No foot drop bilaterally.       Assessment & Plan:   Patient Instructions  For lumbar bone lesion went ahead and placed mri order to evaluate the area. Will need to get piror authorization.  Some features of sciatica since last visit. If a1c well controlled then will give 4 day taper dose medrol  Use xanax 1-2 tab 30 minute prior to the mri.    For elevated sugar  get cmp and a1c. Also for your glucometer need to name brand. Call our office and will get staff to refill supplies.  For difficulty swallowing attempted to place referral to speech pathologist and back to GI MD.  Follow up date to be determined after mri.   Time spent with patient today was 40  minutes which consisted of chart review, discussing diagnoses, work up, treatment , reason for [employer] documentation. Extra time taking that would be premature to give dx of cancer/MM. Need to do study and proceed from there.

## 2021-04-03 NOTE — Telephone Encounter (Signed)
Placed order for lumbar spine mri. Will you do prior authoization. May provide most recent lumbar spine xray where radiolgist recommends.

## 2021-04-03 NOTE — Patient Instructions (Addendum)
For lumbar bone lesion went ahead and placed mri order to evaluate the area. Will need to get piror authorization.  Some features of sciatica since last visit. If a1c well controlled then will give 4 day taper dose medrol  Use xanax 1-2 tab 30 minute prior to the mri.    For elevated sugar get cmp and a1c. Also for your glucometer need to name brand. Call our office and will get staff to refill supplies.  For difficulty swallowing attempted to place referral to speech pathologist and back to GI MD.  Follow up date to be determined after mri.

## 2021-04-03 NOTE — Addendum Note (Signed)
Addended by: Gwenevere Abbot on: 04/03/2021 03:49 PM   Modules accepted: Level of Service

## 2021-04-04 ENCOUNTER — Ambulatory Visit: Payer: Medicare Other | Admitting: Medical

## 2021-04-04 LAB — HEMOGLOBIN A1C: Hgb A1c MFr Bld: 5.9 % (ref 4.6–6.5)

## 2021-04-05 ENCOUNTER — Other Ambulatory Visit: Payer: Self-pay

## 2021-04-05 ENCOUNTER — Encounter: Payer: Self-pay | Admitting: Gastroenterology

## 2021-04-05 ENCOUNTER — Ambulatory Visit (INDEPENDENT_AMBULATORY_CARE_PROVIDER_SITE_OTHER): Payer: Medicare Other | Admitting: Gastroenterology

## 2021-04-05 VITALS — BP 124/58 | HR 73 | Ht 60.0 in | Wt 118.1 lb

## 2021-04-05 DIAGNOSIS — R053 Chronic cough: Secondary | ICD-10-CM | POA: Diagnosis not present

## 2021-04-05 DIAGNOSIS — R0989 Other specified symptoms and signs involving the circulatory and respiratory systems: Secondary | ICD-10-CM | POA: Diagnosis not present

## 2021-04-05 DIAGNOSIS — R1319 Other dysphagia: Secondary | ICD-10-CM

## 2021-04-05 LAB — COMPREHENSIVE METABOLIC PANEL
ALT: 13 U/L (ref 0–35)
AST: 25 U/L (ref 0–37)
Albumin: 4 g/dL (ref 3.5–5.2)
Alkaline Phosphatase: 69 U/L (ref 39–117)
BUN: 18 mg/dL (ref 6–23)
CO2: 38 mEq/L — ABNORMAL HIGH (ref 19–32)
Calcium: 9.6 mg/dL (ref 8.4–10.5)
Chloride: 97 mEq/L (ref 96–112)
Creatinine, Ser: 1.01 mg/dL (ref 0.40–1.20)
GFR: 52.97 mL/min — ABNORMAL LOW (ref >60.00)
Glucose, Bld: 90 mg/dL (ref 70–99)
Potassium: 3.8 mEq/L (ref 3.5–5.1)
Sodium: 139 mEq/L (ref 135–145)
Total Bilirubin: 0.2 mg/dL (ref 0.2–1.2)
Total Protein: 8.6 g/dL — ABNORMAL HIGH (ref 6.0–8.3)

## 2021-04-05 NOTE — Progress Notes (Signed)
Chief Complaint:    Dysphagia, chronic cough  GI History: Nina Holmes is a 81 y.o. female with a history of HTN, HLD, anxiety, cervical fusion 2003, follows in the GI clinic for management of GERD and dysphagia.  Initially seen on 08/05/2020.  Index reflux sxs characterized as dyspepsia, cough, post nasal drip.  Otherwise denies heartburn or regurgitation.  Symptoms worse with times of stress. Was started on Protonix 20 mg/day >1 year ago with improvement in symptoms.   Dysphagia is described as a choking sensation and coughing. Symptoms have been present since cervical spine surgery in 2003, and progressively worse in 2022. Has had EGD >5 years ago in [place]- no report for review but she reports having erosive esophagitis. Has been seen by ENT in [place] as well and reported vocal cord irritation. Choking also worse during times of stress. Can occur with saliva and liquids- with immediate choking/coughing with ingestion of liquids. Generally does ok with solids. Thinks she had a swallow study at some point that was abnormal.  No change with Protonix.  No history of aspiration pneumonia. - 08/22/2020: MBS: Normal.  Possible laryngospasm per Speech Pathologist, but intact oral pharyngeal function recommend outpatient SLP follow-up to address chronic cough and cough suppression therapy   HPI:     Patient is a 81 y.o. female presenting to the Gastroenterology Clinic for continued evaluation of dysphagia.  Last seen by me on 08/01/2020 for the same.  I recommended MBS and Speech Pathology referral followed by EGD +/- Bravo and/or esophagram if unrevealing.  Completed MBS as above and was recommended to follow-up with SLP as outpatient, but she never went to the appointment and never bothered rescheduling.  Symptoms have been present for 20 years now, but thinks this is getting worse. Per family member, has had to perform Heimlich maneuver a few times (but no food was retrieved).  Again described  as cough, choking sensation along with sneezing and rhinorrhea during these episodes.  Episodes occur 2-3 times per day, and worsening over the last few weeks.  Despite duration of symptoms and previous recommendations, would now like studies done "stat".  Presents with her daughter in the office today.  -01/23/2021: CT AP: No acute intra-abdominal pathology.  Diverticulosis, otherwise normal GI tract.  Review of systems:     No chest pain, no SOB, no fevers, no urinary sx   Past Medical History:  Diagnosis Date   Allergy    Anxiety    Cervical fusion syndrome    GERD (gastroesophageal reflux disease)    Hyperlipidemia    Hypertension     Patient's surgical history, family medical history, social history, medications and allergies were all reviewed in Epic    Current Outpatient Medications  Medication Sig Dispense Refill   ALPRAZolam (XANAX) 0.5 MG tablet TAKE 1 TABLET(0.5 MG) BY MOUTH AT BEDTIME AS NEEDED FOR ANXIETY OR SLEEP 30 tablet 0   aspirin 81 MG chewable tablet Chew 1 tablet (81 mg total) by mouth daily. 90 tablet 3   atorvastatin (LIPITOR) 10 MG tablet TAKE 1 TABLET(10 MG) BY MOUTH DAILY 30 tablet 2   cholecalciferol (VITAMIN D3) 25 MCG (1000 UNIT) tablet Take 1 tablet (1,000 Units total) by mouth daily. 90 tablet 3   hydrochlorothiazide (MICROZIDE) 12.5 MG capsule Take 1 capsule (12.5 mg total) by mouth daily. 90 capsule 3   metoprolol tartrate (LOPRESSOR) 25 MG tablet Take 1 tablet (25 mg total) by mouth 2 (two) times daily. 180 tablet 3  pantoprazole (PROTONIX) 20 MG tablet Take 1 tablet (20 mg total) by mouth 2 (two) times daily. 180 tablet 3   No current facility-administered medications for this visit.    Physical Exam:     BP (!) 124/58    Pulse 73    Ht 5' (1.524 m)    Wt 118 lb 2 oz (53.6 kg)    SpO2 99%    BMI 23.07 kg/m   GENERAL:  Pleasant female in NAD PSYCH: : Cooperative, normal affect CARDIAC:  RRR, no murmur heard, no peripheral edema PULM:  Normal respiratory effort, lungs CTA bilaterally, no wheezing ABDOMEN:  Nondistended, soft, nontender. No obvious masses, no hepatomegaly,  normal bowel sounds SKIN:  turgor, no lesions seen Musculoskeletal:  Normal muscle tone, normal strength NEURO: Alert and oriented x 3, no focal neurologic deficits   IMPRESSION and PLAN:    1) Chronic cough 2) Dysphagia 3) Choking sensation  - Referral to Speech Pathology for outpatient evaluation and treatment. [place] location was too far for the patient.  Provided with list of SLP offices that could be closer to her and will also get recommendation from SLP in [place] - Esophagram to ensure no posterior compression from prior cervical hardware - EGD to be scheduled after esophagram to evaluate for mucosal/luminal pathology - Patient requesting referral to ENT for further evaluation as well.  Referral placed  The indications, risks, and benefits of EGD were explained to the patient in detail. Risks include but are not limited to bleeding, perforation, adverse reaction to medications, and cardiopulmonary compromise. Sequelae include but are not limited to the possibility of surgery, hospitalization, and mortality. The patient verbalized understanding and wished to proceed. All questions answered, referred to scheduler. Further recommendations pending results of the exam.            Shellia Cleverly ,DO, FACG 04/05/2021, 1:46 PM

## 2021-04-05 NOTE — Patient Instructions (Addendum)
If you are age 80 or older, your body mass index should be between 23-30. Your Body mass index is 23.07 kg/m. If this is out of the aforementioned range listed, please consider follow up with your Primary Care Provider.  If you are age 70 or younger, your body mass index should be between 19-25. Your Body mass index is 23.07 kg/m. If this is out of the aformentioned range listed, please consider follow up with your Primary Care Provider.   __________________________________________________________  The [facility] providers would like to encourage you to use [facility] to communicate with providers for non-urgent requests or questions.  Due to long hold times on the telephone, sending your provider a message by [facility] may be a faster and more efficient way to get a response.  Please allow 48 business hours for a response.  Please remember that this is for non-urgent requests.   You will be contacted by the central scheduling department for you Esophagram.  We will need to find a speech pathologist and ENT out of the [facility] network per your request  Thank you for choosing me and [facility].  Vito Cirigliano, D.O.

## 2021-04-07 ENCOUNTER — Ambulatory Visit (HOSPITAL_BASED_OUTPATIENT_CLINIC_OR_DEPARTMENT_OTHER)
Admission: RE | Admit: 2021-04-07 | Discharge: 2021-04-07 | Disposition: A | Discharge status: Home / Self Care | Payer: Medicare Other | Source: Ambulatory Visit | Attending: Medical | Admitting: Medical

## 2021-04-07 ENCOUNTER — Other Ambulatory Visit: Payer: Self-pay

## 2021-04-07 DIAGNOSIS — M899 Disorder of bone, unspecified: Secondary | ICD-10-CM

## 2021-04-07 DIAGNOSIS — M545 Low back pain, unspecified: Secondary | ICD-10-CM | POA: Diagnosis present

## 2021-04-08 ENCOUNTER — Other Ambulatory Visit: Payer: Self-pay | Admitting: Medical

## 2021-04-08 ENCOUNTER — Telehealth: Payer: Self-pay | Admitting: Medical

## 2021-04-08 MED ORDER — METHYLPREDNISOLONE 4 MG PO TABS: 4.0000 mg | ORAL_TABLET | Freq: Every day | ORAL | 0 refills | Status: DC

## 2021-04-08 NOTE — Telephone Encounter (Signed)
Rx medrol sent to pharmacy. 

## 2021-04-28 ENCOUNTER — Telehealth: Payer: Self-pay | Admitting: Medical

## 2021-04-28 DIAGNOSIS — R1310 Dysphagia, unspecified: Secondary | ICD-10-CM

## 2021-04-28 NOTE — Telephone Encounter (Signed)
Placed order for new swallowing study. I don't order these often does it need prior authorization. Needs one within 6 months for upcoing speech pathology appointment. ?

## 2021-04-28 NOTE — Telephone Encounter (Signed)
Opened to place order swallowing study. ?

## 2021-05-01 ENCOUNTER — Other Ambulatory Visit: Payer: Self-pay | Admitting: Medical

## 2021-05-02 ENCOUNTER — Telehealth: Payer: Self-pay

## 2021-05-02 NOTE — Telephone Encounter (Signed)
Called pt and gave her the right number ? ?

## 2021-05-02 NOTE — Telephone Encounter (Signed)
Caller Name Ahaana Rochette ?Caller Phone Number [phone] ?Patient Name Nina Holmes ?Patient DOB [birth year] ?Call Type Message Only Information Provided ?Reason for Call Request for General Office Information ?Initial Comment Caller states was given a referral to specialist, states was given disconnected number. ?Disp. Time Disposition Final User ?05/01/2021 5:12:08 PM General Information Provided Yes Reina Fuse ?Call Closed By: Reina Fuse ?Transaction Date/Time: 05/01/2021 5:06:02 PM (ET) ?

## 2021-05-28 ENCOUNTER — Other Ambulatory Visit: Payer: Self-pay | Admitting: Medical

## 2021-06-06 ENCOUNTER — Other Ambulatory Visit: Payer: Self-pay | Admitting: Medical

## 2021-06-06 NOTE — Telephone Encounter (Addendum)
Requesting: Xanax ?Contract: 06/02/2020 ?UDS: 06/22/2020 ?Last OV: 04/03/2021 ?Next OV: N/A ?Last Refill: 02/05/2021, #30--0 RF ?Database: ? ? ?Please advise  ? ?Sent in rx. She need both updated contract and uds before end of month. Go ahead and get her in for controlled med visit within next 2 weeks. ? ?.mec ? ?

## 2021-06-12 MED ORDER — ALPRAZOLAM 0.5 MG PO TABS: ORAL_TABLET | ORAL | 0 refills | Status: DC

## 2021-06-12 NOTE — Telephone Encounter (Addendum)
Patient reports she is in [place] with a sister that has cancer. She was already there when her rx for xanax was sent to local pharmacy. She will get pharmacy information where she is at and we can try to send there for her per pcp. ? ? ? ?I just sent in the xanax to pharmacy in [place] If it went thru please cancel the local one I just sent in recenty. ? ? ?Thanks for helping with this pt. ?

## 2021-06-13 NOTE — Telephone Encounter (Signed)
Rx at local [facility] cancelled as patient reports she is not sure how long she will be out of town. ?

## 2021-06-28 ENCOUNTER — Telehealth: Payer: Self-pay | Admitting: Medical

## 2021-06-28 NOTE — Telephone Encounter (Signed)
Pt called stating that she had a question concerning some medication she bought in [place] She was out of hydrochlorothiazide and her niece bought some in [place] and it looks different to her Pink pill vs. Blue capsule. MG-age is the same: 12.5. She believes 155EP is printed on the pill. She is wondering if this is safe to take. Please Advise. ?

## 2021-06-29 NOTE — Telephone Encounter (Signed)
Called a few times but no answer, lvm for patient to call back 

## 2021-06-29 NOTE — Telephone Encounter (Signed)
Patient returned phone call.   Please advise

## 2021-06-29 NOTE — Telephone Encounter (Signed)
Patient advised if medication name and milligram is the same, it should be ok. The way the medication looks will depend on the maker so it may look different or come in capsules rather than tablets.  ?

## 2021-07-08 ENCOUNTER — Other Ambulatory Visit: Payer: Self-pay | Admitting: Medical

## 2021-09-03 ENCOUNTER — Telehealth: Payer: Self-pay

## 2021-09-03 NOTE — Telephone Encounter (Signed)
Patient called to notified she is transferring care to Dr. Raquel James MD with [facility] health. She reported her family wanted her to transfer.

## 2023-01-17 IMAGING — MR MR LUMBAR SPINE W/O CM
5 series · 32 of 48 positions shown · non-contrast
Comparison: X-ray 03/28/2021.  CT 01/23/2021

CLINICAL DATA: Bone lesion, lumbar spine, incidental xray of lspine
report read lesion may reflect metastatic disease or multiple
myeloma. recomends gettin mri.

EXAM:
MRI LUMBAR SPINE WITHOUT CONTRAST
TECHNIQUE: Multiplanar, multisequence MR imaging of the lumbar spine was
performed. No intravenous contrast was administered.

[Series 3: T2 · sagittal · 4.0mm · 0.51mm/px · 5 of 12 slices shown (1 of 2)]
[im 1/12]
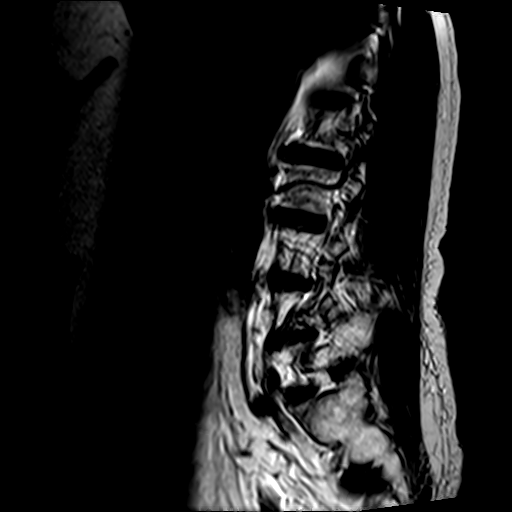
[im 3/12]
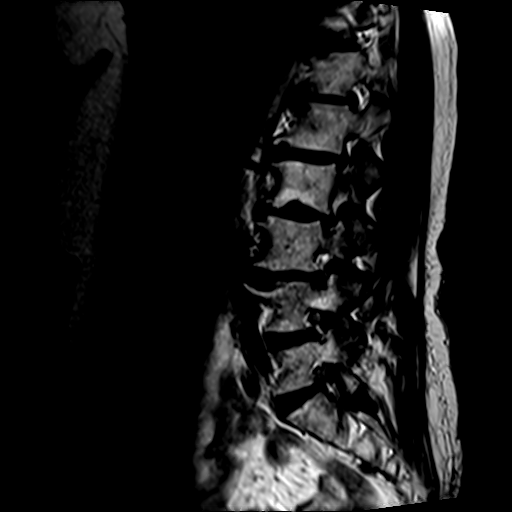
[im 6/12]
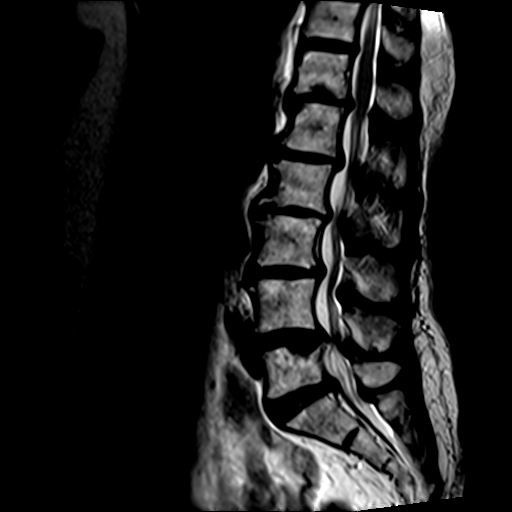
[im 9/12]
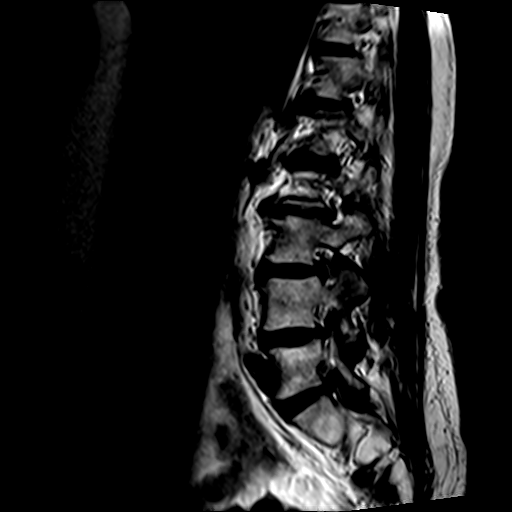
[im 12/12]
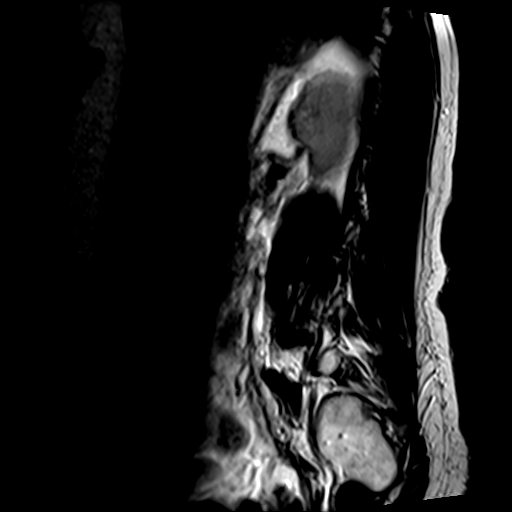

[Series 4: [id number]_p2 · sagittal · 4.0mm · 0.51mm/px · 1 of 12 slices shown]
[im 1/12]
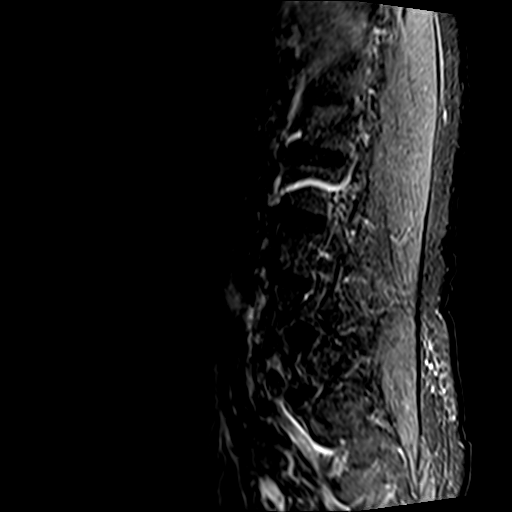

[Series 5: T1 · sagittal · 4.0mm · 0.51mm/px · 6 of 12 slices shown (1 of 2)]
[im 1/12]
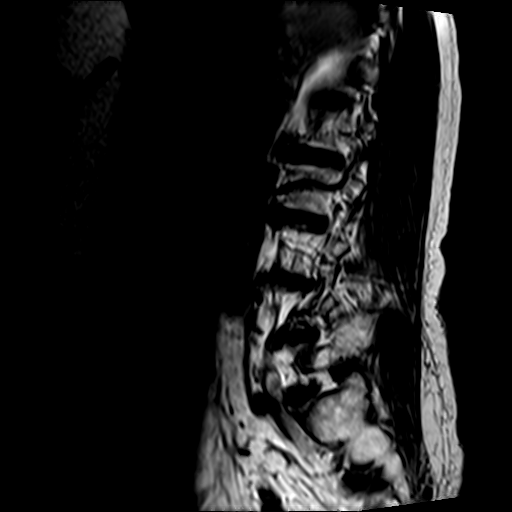
[im 3/12]
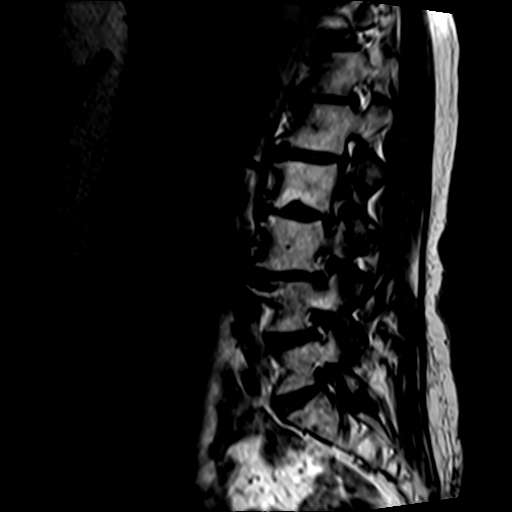
[im 5/12]
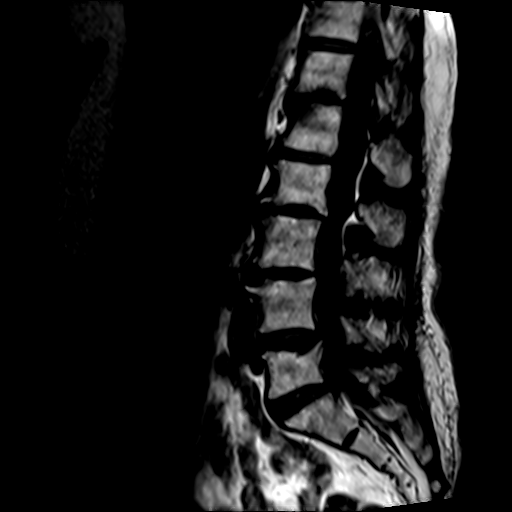
[im 7/12]
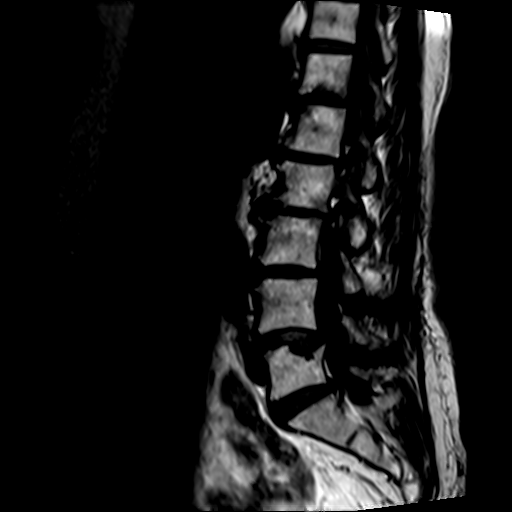
[im 9/12]
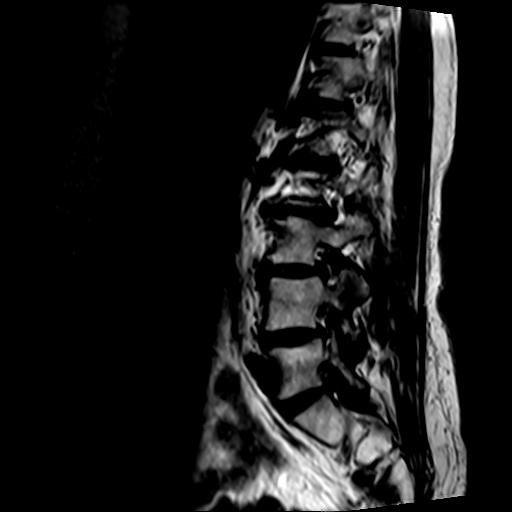
[im 12/12]
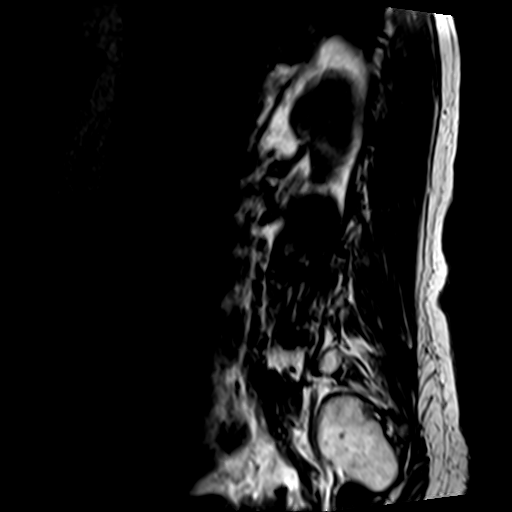

[Series 6: T2 · axial · 4.0mm · 0.78mm/px · z∈[-35,+139]mm · 10 of 34 slices shown (2 of 2)]
[im 3/34]
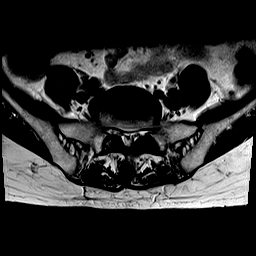
[im 5/34]
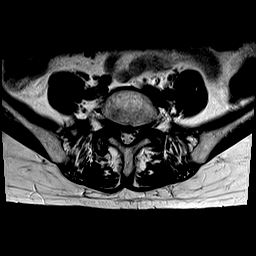
[im 7/34]
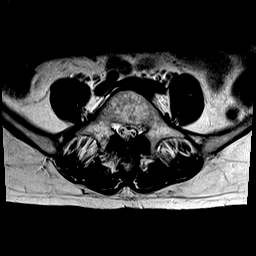
[im 12/34]
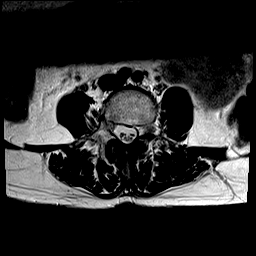
[im 16/34]
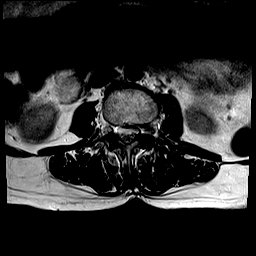
[im 18/34]
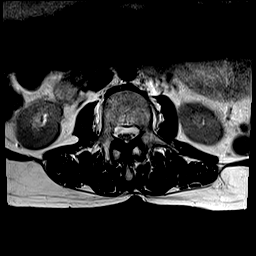
[im 20/34]
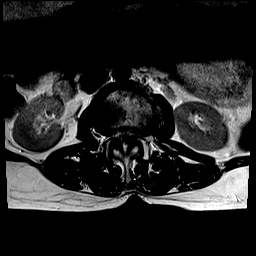
[im 25/34]
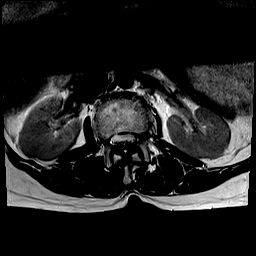
[im 29/34]
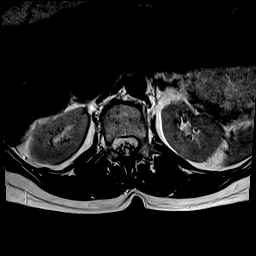
[im 34/34]
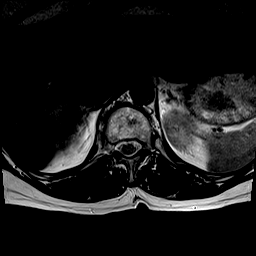

[Series 7: T1 · axial · 4.0mm · 0.78mm/px · z∈[-35,+139]mm · 10 of 34 slices shown (2 of 2)]
[im 3/34]
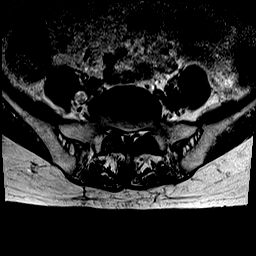
[im 5/34]
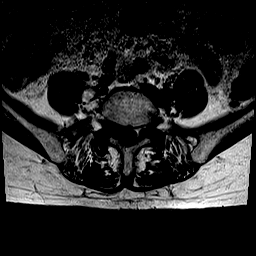
[im 7/34]
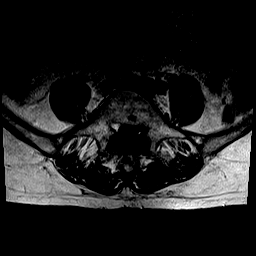
[im 12/34]
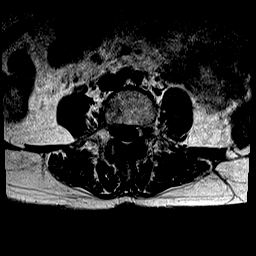
[im 16/34]
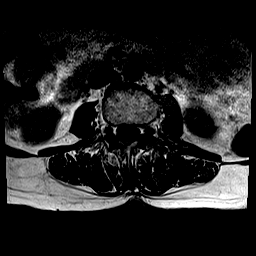
[im 18/34]
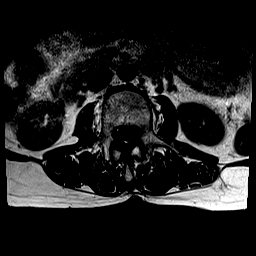
[im 20/34]
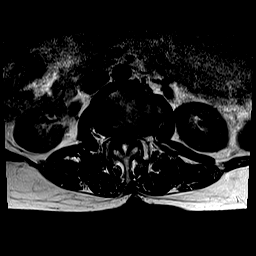
[im 25/34]
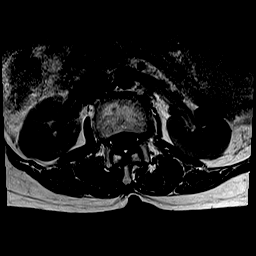
[im 29/34]
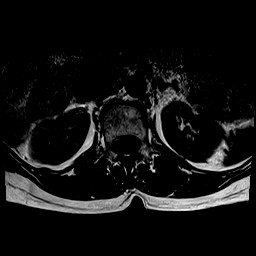
[im 34/34]
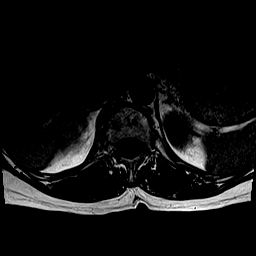

[32 of 48 positions shown; findings below may reference images not displayed]

FINDINGS: Segmentation:  Standard.

Alignment:  Minimal grade 1 anterolisthesis L4 on L5.

Vertebrae: No acute fracture. Discogenic endplate marrow changes are
most pronounced at T12-L1 and L2-3. Prominent superior endplate
Schmorl's node at L5. Additional inferior endplate Schmorl's nodes
at T12 and L2. No evidence of discitis. No marrow replacing bone
lesion. Specifically, no bone lesion involving the L4 vertebral
body, as questioned radiographically.

Conus medullaris and cauda equina: Conus extends to the L1 level.
Conus and cauda equina appear normal.

Paraspinal and other soft tissues: Negative.

Disc levels:

T12-L1: Mild diffuse disc bulge with mild bilateral facet
arthropathy and ligamentum flavum buckling. Findings result in
mild-to-moderate canal stenosis with mild bilateral foraminal
stenosis.

L1-L2: Diffuse disc bulge with mild bilateral facet arthropathy and
ligamentum flavum buckling. Findings result in mild canal stenosis
with moderate left and mild right foraminal stenosis.

L2-L3: Diffuse disc bulge with moderate bilateral facet arthropathy
and ligamentum flavum buckling. Possible developing subligamentous
synovial cyst associated with the left facet joint. Overall,
moderate canal stenosis with severe left and mild right foraminal
stenosis.

L3-L4: Diffuse disc bulge with moderate bilateral facet arthropathy
and ligamentum flavum buckling. Findings result in mild canal
stenosis with mild-moderate bilateral foraminal stenosis.

L4-L5: Anterolisthesis with disc uncovering. Diffuse disc bulge with
moderate bilateral facet arthropathy and ligamentum flavum buckling.
Findings result in moderate canal stenosis with mild-to-moderate
bilateral foraminal stenosis.

L5-S1: Diffuse disc bulge with moderate bilateral facet arthropathy.
Findings result in severe left and moderate right foraminal
stenosis. Mild bilateral subarticular recess stenosis. No
significant canal stenosis.
IMPRESSION: 1. No marrow replacing bone lesion within the lumbar spine.
Previously described area of lucency at the L4 level on x-ray is
favored artifactual related to overlying bowel gas.
2. Advanced multilevel degenerative changes of the lumbar spine.
Findings are most pronounced at the L2-3 level where there is
moderate canal stenosis with severe left and mild right foraminal
stenosis.
3. Moderate canal stenosis at L4-5 with mild-to-moderate bilateral
foraminal stenosis.
4. Severe left and moderate right foraminal stenosis at L5-S1.

## 2023-01-22 ENCOUNTER — Ambulatory Visit
Admission: RE | Admit: 2023-01-22 | Discharge: 2023-01-22 | Disposition: A | Discharge status: Home / Self Care | Payer: 59 | Source: Ambulatory Visit | Attending: Family Medicine | Admitting: Family Medicine

## 2023-01-22 ENCOUNTER — Other Ambulatory Visit: Payer: Self-pay

## 2023-01-22 ENCOUNTER — Ambulatory Visit: Payer: 59

## 2023-01-22 ENCOUNTER — Ambulatory Visit: Payer: Self-pay

## 2023-01-22 VITALS — BP 153/75 | HR 107 | Temp 98.3°F | Resp 22

## 2023-01-22 DIAGNOSIS — R059 Cough, unspecified: Secondary | ICD-10-CM

## 2023-01-22 DIAGNOSIS — J209 Acute bronchitis, unspecified: Secondary | ICD-10-CM

## 2023-01-22 DIAGNOSIS — Z91148 Patient's other noncompliance with medication regimen for other reason: Secondary | ICD-10-CM

## 2023-01-22 MED ORDER — BENZONATATE 200 MG PO CAPS: 200.0000 mg | ORAL_CAPSULE | Freq: Two times a day (BID) | ORAL | 0 refills | Status: AC | PRN

## 2023-01-22 MED ORDER — METHYLPREDNISOLONE ACETATE 80 MG/ML IJ SUSP
40.0000 mg | Freq: Once | INTRAMUSCULAR | Status: AC
  Administered 2023-01-22: 40 mg via INTRAMUSCULAR

## 2023-01-22 MED ORDER — AMOXICILLIN 500 MG PO CAPS: 500.0000 mg | ORAL_CAPSULE | Freq: Two times a day (BID) | ORAL | 0 refills | Status: AC

## 2023-01-22 NOTE — ED Triage Notes (Signed)
Cough, congestion, hoarse voice. Has been sick for about a month, got better briefly but then got ill again. Has been taking robitussin. No fever.

## 2023-01-22 NOTE — Discharge Instructions (Signed)
Take Tessalon 2 times a day.  This is a cough medicine safe with diabetes. Take the antibiotic 2 times a day. Make sure you are drinking lots of water Get plenty of rest The steroid shot might increase your sugar for a few days.  Do not worry about this See your doctor if not improving by next week

## 2023-01-22 NOTE — ED Provider Notes (Addendum)
[facility]    CSN: [id number] Arrival date & time: 01/22/23  1558      History   Chief Complaint No chief complaint on file.   HPI Nina Holmes is a 82 y.o. female.   Patient is here with her daughter.  Daughter states she has been sick for a month.  She has a cough.  Slight shortness of breath.  She is feeling tired and weak.  She does not want to go anywhere.  Appetite is poor.  She is drinking well.  She has tried some over-the-counter medications but does not know exactly what she can take.  Has not had fever or chills.  Has not had testing for COVID or flu.  Gets bronchitis chest infections yearly per daughter  Patient states that she has stopped all her medications.  She states her doctor told her that it was okay, but daughter seems to disagree  Past Medical History:  Diagnosis Date   Allergy    Anxiety    Cervical fusion syndrome    GERD (gastroesophageal reflux disease)    Hyperlipidemia    Hypertension     There are no problems to display for this patient.   Past Surgical History:  Procedure Laterality Date   CERVICAL FUSION  2003   4 - 7 vertibra fusion    OB History   No obstetric history on file.      Home Medications    Prior to Admission medications   Medication Sig Start Date End Date Taking? Authorizing Provider  amoxicillin (AMOXIL) 500 MG capsule Take 1 capsule (500 mg total) by mouth 2 (two) times daily. 01/22/23  Yes Eustace Moore, MD  benzonatate (TESSALON) 200 MG capsule Take 1 capsule (200 mg total) by mouth 2 (two) times daily as needed for cough. 01/22/23  Yes Eustace Moore, MD    Family History Family History  Problem Relation Age of Onset   Heart disease Mother    Colon cancer Neg Hx    Pancreatic cancer Neg Hx    Esophageal cancer Neg Hx    Liver disease Neg Hx    Stomach cancer Neg Hx     Social History Social History   Tobacco Use   Smoking status: Never   Smokeless tobacco: Never   Vaping Use   Vaping status: Never Used  Substance Use Topics   Alcohol use: Not Currently   Drug use: Never     Allergies   Bentyl [dicyclomine], Codeine, Cortizone-10 [hydrocortisone], and Sulfa antibiotics   Review of Systems Review of Systems See HPI  Physical Exam Triage Vital Signs ED Triage Vitals  Encounter Vitals Group     BP 01/22/23 1603 (!) 153/75     Systolic BP Percentile --      Diastolic BP Percentile --      Pulse Rate 01/22/23 1603 (!) 107     Resp 01/22/23 1603 (!) 22     Temp 01/22/23 1603 98.3 F (36.8 C)     Temp Source 01/22/23 1603 Oral     SpO2 01/22/23 1603 95 %     Weight --      Height --      Head Circumference --      Peak Flow --      Pain Score 01/22/23 1607 5     Pain Loc --      Pain Education --      Exclude from Growth Chart --  No data found.  Updated Vital Signs BP (!) 153/75   Pulse (!) 107   Temp 98.3 F (36.8 C) (Oral)   Resp (!) 22   SpO2 95%      Physical Exam Constitutional:      General: She is not in acute distress.    Appearance: She is well-developed and normal weight. She is ill-appearing.  HENT:     Head: Normocephalic and atraumatic.     Right Ear: Ear canal normal.     Left Ear: Tympanic membrane and ear canal normal.     Nose: Nose normal.     Mouth/Throat:     Mouth: Mucous membranes are moist.  Eyes:     Conjunctiva/sclera: Conjunctivae normal.     Pupils: Pupils are equal, round, and reactive to light.  Cardiovascular:     Rate and Rhythm: Normal rate and regular rhythm.     Heart sounds: Normal heart sounds.  Pulmonary:     Effort: Pulmonary effort is normal. No respiratory distress.     Breath sounds: Wheezing and rhonchi present.  Abdominal:     General: There is no distension.     Palpations: Abdomen is soft.  Musculoskeletal:        General: Normal range of motion.     Cervical back: Normal range of motion.  Lymphadenopathy:     Cervical: No cervical adenopathy.  Skin:     General: Skin is warm and dry.  Neurological:     Mental Status: She is alert.      UC Treatments / Results  Labs (all labs ordered are listed, but only abnormal results are displayed) Labs Reviewed - No data to display  EKG   Radiology DG Chest 2 View  Result Date: 01/22/2023 CLINICAL DATA:  Cough EXAM: CHEST - 2 VIEW COMPARISON:  01/23/2021 FINDINGS: The heart size and mediastinal contours are within normal limits. Both lungs are clear. Hardware in the cervical spine. IMPRESSION: No active cardiopulmonary disease. Electronically Signed   By: Jasmine Pang M.D.   On: 01/22/2023 16:58    Procedures Procedures (including critical care time)  Medications Ordered in UC Medications  methylPREDNISolone acetate (DEPO-MEDROL) injection 40 mg (has no administration in time range)    Initial Impression / Assessment and Plan / UC Course  I have reviewed the triage vital signs and the nursing notes.  Pertinent labs & imaging results that were available during my care of the patient were reviewed by me and considered in my medical decision making (see chart for details).    X-ray results discussed with patient.  She is reluctant to try a steroid to help with her cough and infection.  She states that it upsets her stomach and raises her sugar.  I will therefore give her a very small dose given her age, size, and side effects.  I do think will help.  Final Clinical Impressions(s) / UC Diagnoses   Final diagnoses:  Acute bronchitis, unspecified organism     Discharge Instructions      Take Tessalon 2 times a day.  This is a cough medicine safe with diabetes. Take the antibiotic 2 times a day. Make sure you are drinking lots of water Get plenty of rest The steroid shot might increase your sugar for a few days.  Do not worry about this See your doctor if not improving by next week     ED Prescriptions     Medication Sig Dispense Auth. Provider   amoxicillin (  AMOXIL) 500 MG  capsule Take 1 capsule (500 mg total) by mouth 2 (two) times daily. 20 capsule Eustace Moore, MD   benzonatate (TESSALON) 200 MG capsule Take 1 capsule (200 mg total) by mouth 2 (two) times daily as needed for cough. 20 capsule Eustace Moore, MD      PDMP not reviewed this encounter.   Eustace Moore, MD 01/22/23 1716    Eustace Moore, MD 01/22/23 [phone]
